# Patient Record
Sex: Female | Born: 1975
Health system: Southern US, Community
[De-identification: ages and names within clinical notes are randomized; demographics above are authoritative.]

## PROBLEM LIST (undated history)

## (undated) DIAGNOSIS — F419 Anxiety disorder, unspecified: Secondary | ICD-10-CM

## (undated) DIAGNOSIS — M329 Systemic lupus erythematosus, unspecified: Secondary | ICD-10-CM

## (undated) DIAGNOSIS — F3181 Bipolar II disorder: Secondary | ICD-10-CM

## (undated) DIAGNOSIS — G4700 Insomnia, unspecified: Secondary | ICD-10-CM

## (undated) DIAGNOSIS — M1990 Unspecified osteoarthritis, unspecified site: Secondary | ICD-10-CM

## (undated) DIAGNOSIS — I2699 Other pulmonary embolism without acute cor pulmonale: Secondary | ICD-10-CM

## (undated) DIAGNOSIS — IMO0002 Reserved for concepts with insufficient information to code with codable children: Secondary | ICD-10-CM

## (undated) HISTORY — DX: Bipolar II disorder: F31.81

## (undated) HISTORY — DX: Other pulmonary embolism without acute cor pulmonale: I26.99

## (undated) HISTORY — DX: Reserved for concepts with insufficient information to code with codable children: IMO0002

## (undated) HISTORY — DX: Insomnia, unspecified: G47.00

## (undated) HISTORY — DX: Systemic lupus erythematosus, unspecified: M32.9

## (undated) HISTORY — PX: TOTAL VAGINAL HYSTERECTOMY: SHX2548

## (undated) HISTORY — DX: Unspecified osteoarthritis, unspecified site: M19.90

## (undated) HISTORY — PX: OTHER SURGICAL HISTORY: SHX169

## (undated) HISTORY — DX: Anxiety disorder, unspecified: F41.9

## (undated) HISTORY — PX: CHOLECYSTECTOMY: SHX55

## (undated) HISTORY — PX: FOOT SURGERY: SHX648

---

## 2014-05-05 DIAGNOSIS — Z86711 Personal history of pulmonary embolism: Secondary | ICD-10-CM | POA: Insufficient documentation

## 2014-05-05 HISTORY — DX: Personal history of pulmonary embolism: Z86.711

## 2016-08-13 DIAGNOSIS — Z8679 Personal history of other diseases of the circulatory system: Secondary | ICD-10-CM

## 2016-08-13 HISTORY — DX: Personal history of other diseases of the circulatory system: Z86.79

## 2016-10-09 DIAGNOSIS — R296 Repeated falls: Secondary | ICD-10-CM | POA: Insufficient documentation

## 2016-10-09 HISTORY — DX: Repeated falls: R29.6

## 2017-01-14 DIAGNOSIS — B029 Zoster without complications: Secondary | ICD-10-CM | POA: Insufficient documentation

## 2017-01-14 HISTORY — DX: Zoster without complications: B02.9

## 2017-03-11 DIAGNOSIS — M1711 Unilateral primary osteoarthritis, right knee: Secondary | ICD-10-CM

## 2017-03-11 HISTORY — DX: Unilateral primary osteoarthritis, right knee: M17.11

## 2018-05-11 DIAGNOSIS — F329 Major depressive disorder, single episode, unspecified: Secondary | ICD-10-CM

## 2018-05-11 HISTORY — DX: Major depressive disorder, single episode, unspecified: F32.9

## 2018-06-18 DIAGNOSIS — Z79899 Other long term (current) drug therapy: Secondary | ICD-10-CM

## 2018-06-18 DIAGNOSIS — Z86718 Personal history of other venous thrombosis and embolism: Secondary | ICD-10-CM

## 2018-06-18 DIAGNOSIS — J449 Chronic obstructive pulmonary disease, unspecified: Secondary | ICD-10-CM | POA: Insufficient documentation

## 2018-06-18 HISTORY — DX: Personal history of other venous thrombosis and embolism: Z86.718

## 2018-06-18 HISTORY — DX: Chronic obstructive pulmonary disease, unspecified: J44.9

## 2018-06-18 HISTORY — DX: Other long term (current) drug therapy: Z79.899

## 2018-06-25 ENCOUNTER — Ambulatory Visit: Payer: Medicaid Other | Admitting: Cardiology

## 2018-07-02 ENCOUNTER — Ambulatory Visit (INDEPENDENT_AMBULATORY_CARE_PROVIDER_SITE_OTHER): Payer: Medicaid Other | Admitting: Cardiology

## 2018-07-02 ENCOUNTER — Encounter: Payer: Self-pay | Admitting: Cardiology

## 2018-07-02 VITALS — BP 124/82 | HR 77 | Ht 68.0 in | Wt 252.0 lb

## 2018-07-02 DIAGNOSIS — E782 Mixed hyperlipidemia: Secondary | ICD-10-CM

## 2018-07-02 DIAGNOSIS — Z86718 Personal history of other venous thrombosis and embolism: Secondary | ICD-10-CM | POA: Diagnosis not present

## 2018-07-02 DIAGNOSIS — Z86711 Personal history of pulmonary embolism: Secondary | ICD-10-CM

## 2018-07-02 DIAGNOSIS — R011 Cardiac murmur, unspecified: Secondary | ICD-10-CM

## 2018-07-02 HISTORY — DX: Mixed hyperlipidemia: E78.2

## 2018-07-02 NOTE — Patient Instructions (Signed)
Medication Instructions:  Your physician recommends that you continue on your current medications as directed. Please refer to the Current Medication list given to you today. If you need a refill on your cardiac medications before your next appointment, please call your pharmacy.   Lab work: None   If you have labs (blood work) drawn today and your tests are completely normal, you will receive your results only by: . MyChart Message (if you have MyChart) OR . A paper copy in the mail If you have any lab test that is abnormal or we need to change your treatment, we will call you to review the results.  Testing/Procedures: Your physician has requested that you have an echocardiogram. Echocardiography is a painless test that uses sound waves to create images of your heart. It provides your doctor with information about the size and shape of your heart and how well your heart's chambers and valves are working. This procedure takes approximately one hour. There are no restrictions for this procedure.   Echocardiogram An echocardiogram is a procedure that uses painless sound waves (ultrasound) to produce an image of the heart. Images from an echocardiogram can provide important information about:  Signs of coronary artery disease (CAD).  Aneurysm detection. An aneurysm is a weak or damaged part of an artery wall that bulges out from the normal force of blood pumping through the body.  Heart size and shape. Changes in the size or shape of the heart can be associated with certain conditions, including heart failure, aneurysm, and CAD.  Heart muscle function.  Heart valve function.  Signs of a past heart attack.  Fluid buildup around the heart.  Thickening of the heart muscle.  A tumor or infectious growth around the heart valves. Tell a health care provider about:  Any allergies you have.  All medicines you are taking, including vitamins, herbs, eye drops, creams, and over-the-counter  medicines.  Any blood disorders you have.  Any surgeries you have had.  Any medical conditions you have.  Whether you are pregnant or may be pregnant. What are the risks? Generally, this is a safe procedure. However, problems may occur, including:  Allergic reaction to dye (contrast) that may be used during the procedure. What happens before the procedure? No specific preparation is needed. You may eat and drink normally. What happens during the procedure?   An IV tube may be inserted into one of your veins.  You may receive contrast through this tube. A contrast is an injection that improves the quality of the pictures from your heart.  A gel will be applied to your chest.  A wand-like tool (transducer) will be moved over your chest. The gel will help to transmit the sound waves from the transducer.  The sound waves will harmlessly bounce off of your heart to allow the heart images to be captured in real-time motion. The images will be recorded on a computer. The procedure may vary among health care providers and hospitals. What happens after the procedure?  You may return to your normal, everyday life, including diet, activities, and medicines, unless your health care provider tells you not to do that. Summary  An echocardiogram is a procedure that uses painless sound waves (ultrasound) to produce an image of the heart.  Images from an echocardiogram can provide important information about the size and shape of your heart, heart muscle function, heart valve function, and fluid buildup around your heart.  You do not need to do anything to   prepare before this procedure. You may eat and drink normally.  After the echocardiogram is completed, you may return to your normal, everyday life, unless your health care provider tells you not to do that. This information is not intended to replace advice given to you by your health care provider. Make sure you discuss any questions you  have with your health care provider. Document Released: 04/18/2000 Document Revised: 05/24/2016 Document Reviewed: 05/24/2016 Elsevier Interactive Patient Education  2019 Elsevier Inc.   Follow-Up: At [facility], you and your health needs are our priority.  As part of our continuing mission to provide you with exceptional heart care, we have created designated Provider Care Teams.  These Care Teams include your primary Cardiologist (physician) and Advanced Practice Providers (APPs -  Physician Assistants and Nurse Practitioners) who all work together to provide you with the care you need, when you need it. You will need a follow up appointment in 6 months.  Please call our office 2 months in advance to schedule this appointment.  You may see Dr. Revankar or another member of our [facility] Provider Team in [place]: Robert Krasowski, MD . Brian Munley, MD     

## 2018-07-02 NOTE — Addendum Note (Signed)
Addended by: Rodney Langton on: 07/02/2018 10:02 AM   Modules accepted: Orders

## 2018-07-02 NOTE — Progress Notes (Signed)
Cardiology Office Note:    Date:  07/02/2018   ID:  Kathleen Hahn, DOB 11-30-1975, MRN [id number]  PCP:  Dulce Sellar, NP  Cardiologist:  Garwin Brothers, MD   Referring MD: Dulce Sellar, NP    ASSESSMENT:    1. Cardiac murmur   2. History of DVT (deep vein thrombosis)   3. History of pulmonary embolus (PE)   4. Mixed dyslipidemia    PLAN:    In order of problems listed above:  1. Primary prevention stressed with the patient.  Importance of compliance with diet and medication stressed and she vocalized understanding 2. .  Her anticoagulation is followed by her primary care physician.  Benefits and potential risks explained to her. 3. Echocardiogram will be done to assess murmur heard on auscultation. 4. Importance of regular exercise and weight loss stressed.  Risks of obesity explained 5. Patient will be seen in follow-up appointment in 6 months or earlier if the patient has any concerns    Medication Adjustments/Labs and Tests Ordered: Current medicines are reviewed at length with the patient today.  Concerns regarding medicines are outlined above.  No orders of the defined types were placed in this encounter.  No orders of the defined types were placed in this encounter.    History of Present Illness:    Kathleen Hahn is a 43 y.o. female who is being seen today for the evaluation of essential hypertension and history of pericardial effusion.  At the request of Dulce Sellar, NP.  Patient is a pleasant 43 year old female.  She is accompanied by her husband.  She has history of pericardial effusion post pericardiocentesis twice in the past more than 2 years ago.  She denies any problems at this time.  No chest pain orthopnea or PND.  She is here to be established.  She has had history of DVT and pulmonary embolism and is taking anticoagulation.  At the time of my evaluation, the patient is alert awake oriented and in no distress.  She leads a sedentary lifestyle  and does not exercise on a regular basis.  No history of chest pain orthopnea or PND.  Past Medical History:  Diagnosis Date  . Anxiety   . Arthritis   . Bipolar 2 disorder (HCC)   . Insomnia   . Lupus (HCC)   . Pulmonary embolism (HCC)     History reviewed. No pertinent surgical history.  Current Medications: Current Meds  Medication Sig  . apixaban (ELIQUIS) 5 MG TABS tablet Take 1 tablet by mouth 2 (two) times daily.  . budesonide-formoterol (SYMBICORT) 160-4.5 MCG/ACT inhaler INHALE 2 PUFFS BY MOUTH TWICE DAILY AS NEEDED  . busPIRone (BUSPAR) 15 MG tablet Take 1 tablet by mouth 3 (three) times daily.  Marland Kitchen diltiazem (TIAZAC) 180 MG 24 hr capsule Take 180 mg by mouth daily.  Marland Kitchen FLUoxetine (PROZAC) 40 MG capsule Take 1 capsule by mouth daily.  . hydroxychloroquine (PLAQUENIL) 200 MG tablet Take 1 tablet by mouth 2 (two) times daily.  . QUEtiapine (SEROQUEL) 25 MG tablet Take 1 tablet by mouth daily.  . simvastatin (ZOCOR) 20 MG tablet Take 20 mg by mouth at bedtime.  Marland Kitchen VIBERZI 100 MG TABS Take 1 tablet by mouth 2 (two) times daily.  Marland Kitchen zolpidem (AMBIEN) 10 MG tablet Take 10 mg by mouth at bedtime.     Allergies:   Penicillin g   Social History   Socioeconomic History  . Marital status: Unknown    Spouse name: Not on  file  . Number of children: Not on file  . Years of education: Not on file  . Highest education level: Not on file  Occupational History  . Not on file  Social Needs  . Financial resource strain: Not on file  . Food insecurity:    Worry: Not on file    Inability: Not on file  . Transportation needs:    Medical: Not on file    Non-medical: Not on file  Tobacco Use  . Smoking status: Former Games developer  . Smokeless tobacco: Never Used  Substance and Sexual Activity  . Alcohol use: Not on file  . Drug use: Not on file  . Sexual activity: Not on file  Lifestyle  . Physical activity:    Days per week: Not on file    Minutes per session: Not on file  .  Stress: Not on file  Relationships  . Social connections:    Talks on phone: Not on file    Gets together: Not on file    Attends religious service: Not on file    Active member of club or organization: Not on file    Attends meetings of clubs or organizations: Not on file    Relationship status: Not on file  Other Topics Concern  . Not on file  Social History Narrative  . Not on file     Family History: The patient's family history is not on file.  ROS:   Please see the history of present illness.    All other systems reviewed and are negative.  EKGs/Labs/Other Studies Reviewed:    The following studies were reviewed today: EKG reveals sinus rhythm and nonspecific ST-T changes.   Recent Labs: No results found for requested labs within last 8760 hours.  Recent Lipid Panel No results found for: CHOL, TRIG, HDL, CHOLHDL, VLDL, LDLCALC, LDLDIRECT  Physical Exam:    VS:  BP 124/82 (BP Location: Right Arm, Patient Position: Sitting, Cuff Size: Normal)   Pulse 77   Ht 5\' 8"  (1.727 m)   Wt 252 lb (114.3 kg)   SpO2 92%   BMI 38.32 kg/m     Wt Readings from Last 3 Encounters:  07/02/18 252 lb (114.3 kg)     GEN: Patient is in no acute distress HEENT: Normal NECK: No JVD; No carotid bruits LYMPHATICS: No lymphadenopathy CARDIAC: S1 S2 regular, 2/6 systolic murmur at the apex. RESPIRATORY:  Clear to auscultation without rales, wheezing or rhonchi  ABDOMEN: Soft, non-tender, non-distended MUSCULOSKELETAL:  No edema; No deformity  SKIN: Warm and dry NEUROLOGIC:  Alert and oriented x 3 PSYCHIATRIC:  Normal affect    Signed, Garwin Brothers, MD  07/02/2018 9:49 AM    [facility]

## 2018-08-05 ENCOUNTER — Other Ambulatory Visit: Payer: Medicaid Other

## 2018-10-27 DIAGNOSIS — R7989 Other specified abnormal findings of blood chemistry: Secondary | ICD-10-CM | POA: Insufficient documentation

## 2018-10-27 DIAGNOSIS — G5602 Carpal tunnel syndrome, left upper limb: Secondary | ICD-10-CM | POA: Insufficient documentation

## 2018-10-27 DIAGNOSIS — M329 Systemic lupus erythematosus, unspecified: Secondary | ICD-10-CM | POA: Insufficient documentation

## 2018-10-27 HISTORY — DX: Carpal tunnel syndrome, left upper limb: G56.02

## 2018-10-27 HISTORY — DX: Other specified abnormal findings of blood chemistry: R79.89

## 2018-11-03 DIAGNOSIS — F329 Major depressive disorder, single episode, unspecified: Secondary | ICD-10-CM | POA: Diagnosis not present

## 2018-11-12 DIAGNOSIS — Z1231 Encounter for screening mammogram for malignant neoplasm of breast: Secondary | ICD-10-CM | POA: Diagnosis not present

## 2018-11-17 DIAGNOSIS — F329 Major depressive disorder, single episode, unspecified: Secondary | ICD-10-CM | POA: Diagnosis not present

## 2018-11-19 DIAGNOSIS — R200 Anesthesia of skin: Secondary | ICD-10-CM | POA: Diagnosis not present

## 2018-11-19 DIAGNOSIS — R748 Abnormal levels of other serum enzymes: Secondary | ICD-10-CM | POA: Diagnosis not present

## 2018-11-19 DIAGNOSIS — E538 Deficiency of other specified B group vitamins: Secondary | ICD-10-CM | POA: Diagnosis not present

## 2018-11-19 DIAGNOSIS — G4700 Insomnia, unspecified: Secondary | ICD-10-CM | POA: Diagnosis not present

## 2018-11-19 DIAGNOSIS — M1990 Unspecified osteoarthritis, unspecified site: Secondary | ICD-10-CM | POA: Diagnosis not present

## 2018-11-19 DIAGNOSIS — G5602 Carpal tunnel syndrome, left upper limb: Secondary | ICD-10-CM | POA: Diagnosis not present

## 2018-11-19 DIAGNOSIS — R5383 Other fatigue: Secondary | ICD-10-CM | POA: Diagnosis not present

## 2018-11-24 DIAGNOSIS — F329 Major depressive disorder, single episode, unspecified: Secondary | ICD-10-CM | POA: Diagnosis not present

## 2018-11-25 DIAGNOSIS — Z6841 Body Mass Index (BMI) 40.0 and over, adult: Secondary | ICD-10-CM | POA: Diagnosis not present

## 2018-11-25 DIAGNOSIS — R748 Abnormal levels of other serum enzymes: Secondary | ICD-10-CM | POA: Diagnosis not present

## 2018-11-25 DIAGNOSIS — R5383 Other fatigue: Secondary | ICD-10-CM | POA: Diagnosis not present

## 2018-11-25 DIAGNOSIS — M329 Systemic lupus erythematosus, unspecified: Secondary | ICD-10-CM | POA: Diagnosis not present

## 2018-11-26 DIAGNOSIS — R945 Abnormal results of liver function studies: Secondary | ICD-10-CM | POA: Diagnosis not present

## 2018-11-26 DIAGNOSIS — R531 Weakness: Secondary | ICD-10-CM | POA: Diagnosis not present

## 2018-11-26 DIAGNOSIS — R740 Nonspecific elevation of levels of transaminase and lactic acid dehydrogenase [LDH]: Secondary | ICD-10-CM | POA: Diagnosis not present

## 2018-11-26 DIAGNOSIS — R1084 Generalized abdominal pain: Secondary | ICD-10-CM | POA: Diagnosis not present

## 2018-11-26 DIAGNOSIS — J449 Chronic obstructive pulmonary disease, unspecified: Secondary | ICD-10-CM | POA: Diagnosis not present

## 2018-12-03 ENCOUNTER — Telehealth: Payer: Self-pay | Admitting: *Deleted

## 2018-12-03 DIAGNOSIS — R7989 Other specified abnormal findings of blood chemistry: Secondary | ICD-10-CM | POA: Diagnosis not present

## 2018-12-03 DIAGNOSIS — M329 Systemic lupus erythematosus, unspecified: Secondary | ICD-10-CM | POA: Diagnosis not present

## 2018-12-03 DIAGNOSIS — Z79899 Other long term (current) drug therapy: Secondary | ICD-10-CM | POA: Diagnosis not present

## 2018-12-03 NOTE — Telephone Encounter (Signed)
Sent pt a calender with new appt date. Spoke with pt also.

## 2018-12-06 DIAGNOSIS — F329 Major depressive disorder, single episode, unspecified: Secondary | ICD-10-CM | POA: Diagnosis not present

## 2018-12-10 DIAGNOSIS — R748 Abnormal levels of other serum enzymes: Secondary | ICD-10-CM | POA: Diagnosis not present

## 2018-12-21 DIAGNOSIS — F319 Bipolar disorder, unspecified: Secondary | ICD-10-CM | POA: Diagnosis not present

## 2018-12-21 DIAGNOSIS — R7989 Other specified abnormal findings of blood chemistry: Secondary | ICD-10-CM | POA: Diagnosis not present

## 2018-12-21 DIAGNOSIS — F419 Anxiety disorder, unspecified: Secondary | ICD-10-CM | POA: Diagnosis not present

## 2018-12-21 DIAGNOSIS — N3281 Overactive bladder: Secondary | ICD-10-CM | POA: Diagnosis not present

## 2018-12-21 DIAGNOSIS — R000 Tachycardia, unspecified: Secondary | ICD-10-CM | POA: Diagnosis not present

## 2018-12-21 DIAGNOSIS — I82409 Acute embolism and thrombosis of unspecified deep veins of unspecified lower extremity: Secondary | ICD-10-CM | POA: Diagnosis not present

## 2018-12-21 DIAGNOSIS — E559 Vitamin D deficiency, unspecified: Secondary | ICD-10-CM | POA: Diagnosis not present

## 2018-12-21 DIAGNOSIS — Z7901 Long term (current) use of anticoagulants: Secondary | ICD-10-CM | POA: Diagnosis not present

## 2018-12-21 DIAGNOSIS — F329 Major depressive disorder, single episode, unspecified: Secondary | ICD-10-CM | POA: Diagnosis not present

## 2018-12-21 DIAGNOSIS — M329 Systemic lupus erythematosus, unspecified: Secondary | ICD-10-CM | POA: Diagnosis not present

## 2018-12-21 DIAGNOSIS — F3160 Bipolar disorder, current episode mixed, unspecified: Secondary | ICD-10-CM | POA: Diagnosis not present

## 2018-12-23 DIAGNOSIS — R109 Unspecified abdominal pain: Secondary | ICD-10-CM | POA: Diagnosis not present

## 2018-12-23 DIAGNOSIS — R7989 Other specified abnormal findings of blood chemistry: Secondary | ICD-10-CM | POA: Diagnosis not present

## 2018-12-31 ENCOUNTER — Ambulatory Visit: Payer: Medicaid Other | Admitting: Cardiology

## 2018-12-31 DIAGNOSIS — M1711 Unilateral primary osteoarthritis, right knee: Secondary | ICD-10-CM | POA: Diagnosis not present

## 2019-01-05 DIAGNOSIS — K029 Dental caries, unspecified: Secondary | ICD-10-CM | POA: Diagnosis not present

## 2019-01-05 DIAGNOSIS — M1711 Unilateral primary osteoarthritis, right knee: Secondary | ICD-10-CM | POA: Diagnosis not present

## 2019-01-05 DIAGNOSIS — M25561 Pain in right knee: Secondary | ICD-10-CM | POA: Diagnosis not present

## 2019-01-06 DIAGNOSIS — N200 Calculus of kidney: Secondary | ICD-10-CM | POA: Diagnosis not present

## 2019-01-07 ENCOUNTER — Encounter: Payer: Self-pay | Admitting: Cardiology

## 2019-01-07 ENCOUNTER — Ambulatory Visit (INDEPENDENT_AMBULATORY_CARE_PROVIDER_SITE_OTHER): Payer: Medicare HMO | Admitting: Cardiology

## 2019-01-07 ENCOUNTER — Other Ambulatory Visit: Payer: Self-pay

## 2019-01-07 VITALS — BP 120/80 | HR 83 | Ht 68.0 in | Wt 256.0 lb

## 2019-01-07 DIAGNOSIS — Z86711 Personal history of pulmonary embolism: Secondary | ICD-10-CM

## 2019-01-07 DIAGNOSIS — Z86718 Personal history of other venous thrombosis and embolism: Secondary | ICD-10-CM | POA: Diagnosis not present

## 2019-01-07 DIAGNOSIS — E782 Mixed hyperlipidemia: Secondary | ICD-10-CM | POA: Diagnosis not present

## 2019-01-07 DIAGNOSIS — R0609 Other forms of dyspnea: Secondary | ICD-10-CM | POA: Diagnosis not present

## 2019-01-07 NOTE — Progress Notes (Signed)
Cardiology Office Note:    Date:  01/07/2019   ID:  Kathleen Hahn, DOB 07/14/1975, MRN [id number]  PCP:  Dola Factor., MD  Cardiologist:  Jenean Lindau, MD   Referring MD: Jeanie Sewer, NP    ASSESSMENT:    1. Mixed dyslipidemia   2. History of pulmonary embolus (PE)   3. History of DVT (deep vein thrombosis)    PLAN:    In order of problems listed above:  1. Dyspnea on exertion: Her symptoms are concerning.  In view of this she will come back to get her echocardiogram that was scheduled last time.  I also told her that she needs to get coronary artery disease evaluation and a Lexiscan sestamibi with 2-day protocol and she is agreeable.  Benefits and potential risks explained and she vocalized understanding.  She knows to go to the nearest emergency room for any concerning symptoms.Patient will be seen in follow-up appointment in 6 months or earlier if the patient has any concerns    Medication Adjustments/Labs and Tests Ordered: Current medicines are reviewed at length with the patient today.  Concerns regarding medicines are outlined above.  No orders of the defined types were placed in this encounter.  No orders of the defined types were placed in this encounter.    Chief Complaint  Patient presents with  . Follow-up     History of Present Illness:    Kathleen Hahn is a 43 y.o. female.  Patient has history of deep venous thrombosis and pulmonary embolism so she is on anticoagulation.  She denies any problems at this time her only issue shortness of breath on exertion.  It has been persistent since the last time she saw me.  She did not get any testing done because of COVID-19.  She wanted to hold off from it.  She is a smoker but quit some time ago.  At the time of my evaluation she is alert awake oriented and in no distress.  Past Medical History:  Diagnosis Date  . Anxiety   . Arthritis   . Bipolar 2 disorder ([place])   . Insomnia   . Lupus ([place])   .  Pulmonary embolism ([place])     No past surgical history on file.  Current Medications: Current Meds  Medication Sig  . albuterol (PROAIR HFA) 108 (90 Base) MCG/ACT inhaler INHALE 2 PUFFS BY MOUTH EVERY 4 TO 6 HOURS AS NEEDED  . apixaban (ELIQUIS) 5 MG TABS tablet Take 1 tablet by mouth 2 (two) times daily.  . budesonide-formoterol (SYMBICORT) 160-4.5 MCG/ACT inhaler INHALE 2 PUFFS BY MOUTH TWICE DAILY AS NEEDED  . celecoxib (CELEBREX) 200 MG capsule Take 200 mg by mouth daily.  . Cholecalciferol (VITAMIN D3) 1.25 MG (50000 UT) CAPS Take 1 capsule by mouth once a week.  . diltiazem (TIAZAC) 180 MG 24 hr capsule Take 180 mg by mouth daily.  . hydrochlorothiazide (MICROZIDE) 12.5 MG capsule Take 12.5 mg by mouth daily.  . hydroxychloroquine (PLAQUENIL) 200 MG tablet Take 1 tablet by mouth 2 (two) times daily.  . ondansetron (ZOFRAN-ODT) 8 MG disintegrating tablet every 8 hours.  Marland Kitchen oxybutynin (DITROPAN) 5 MG tablet Take 5 mg by mouth 3 (three) times daily.  . pregabalin (LYRICA) 100 MG capsule TAKE 1 (ONE) CAPSULE CAPSULE TWO TIMES DAILY  . triamcinolone acetonide (KENALOG-40) 40 MG/ML injection (RADIOLOGY ONLY) Inject into the articular space.  . valACYclovir (VALTREX) 500 MG tablet TAKE 4 TABLETS NOW, 4 TABLETS IN 12 HOURS. MAY  REPEAT FOR NEW OUTBREAKS  . VIBERZI 100 MG TABS Take 1 tablet by mouth 2 (two) times daily.  Marland Kitchen. zolpidem (AMBIEN) 10 MG tablet Take 10 mg by mouth at bedtime.     Allergies:   Penicillin g   Social History   Socioeconomic History  . Marital status: Unknown    Spouse name: Not on file  . Number of children: Not on file  . Years of education: Not on file  . Highest education level: Not on file  Occupational History  . Not on file  Social Needs  . Financial resource strain: Not on file  . Food insecurity    Worry: Not on file    Inability: Not on file  . Transportation needs    Medical: Not on file    Non-medical: Not on file  Tobacco Use  . Smoking  status: Former Games developermoker  . Smokeless tobacco: Never Used  Substance and Sexual Activity  . Alcohol use: Not on file  . Drug use: Not on file  . Sexual activity: Not on file  Lifestyle  . Physical activity    Days per week: Not on file    Minutes per session: Not on file  . Stress: Not on file  Relationships  . Social Musicianconnections    Talks on phone: Not on file    Gets together: Not on file    Attends religious service: Not on file    Active member of club or organization: Not on file    Attends meetings of clubs or organizations: Not on file    Relationship status: Not on file  Other Topics Concern  . Not on file  Social History Narrative  . Not on file     Family History: The patient's family history is not on file.  ROS:   Please see the history of present illness.    All other systems reviewed and are negative.  EKGs/Labs/Other Studies Reviewed:    The following studies were reviewed today: I discussed and reviewed EKG with her    Recent Labs: No results found for requested labs within last 8760 hours.  Recent Lipid Panel No results found for: CHOL, TRIG, HDL, CHOLHDL, VLDL, LDLCALC, LDLDIRECT  Physical Exam:    VS:  BP 120/80 (BP Location: Left Arm, Patient Position: Sitting, Cuff Size: Normal)   Pulse 83   Ht 5\' 8"  (1.727 m)   Wt 256 lb (116.1 kg)   SpO2 98%   BMI 38.92 kg/m     Wt Readings from Last 3 Encounters:  01/07/19 256 lb (116.1 kg)  07/02/18 252 lb (114.3 kg)     GEN: Patient is in no acute distress HEENT: Normal NECK: No JVD; No carotid bruits LYMPHATICS: No lymphadenopathy CARDIAC: Hear sounds regular, 2/6 systolic murmur at the apex. RESPIRATORY:  Clear to auscultation without rales, wheezing or rhonchi  ABDOMEN: Soft, non-tender, non-distended MUSCULOSKELETAL:  No edema; No deformity  SKIN: Warm and dry NEUROLOGIC:  Alert and oriented x 3 PSYCHIATRIC:  Normal affect   Signed, Garwin Brothersajan R , MD  01/07/2019 4:20 PM    [facility]

## 2019-01-07 NOTE — Patient Instructions (Addendum)
Medication Instructions:  Your physician recommends that you continue on your current medications as directed. Please refer to the Current Medication list given to you today.  If you need a refill on your cardiac medications before your next appointment, please call your pharmacy.   Lab work: NONE If you have labs (blood work) drawn today and your tests are completely normal, you will receive your results only by: Marland Kitchen. MyChart Message (if you have MyChart) OR . A paper copy in the mail If you have any lab test that is abnormal or we need to change your treatment, we will call you to review the results.  Testing/Procedures:  Your physician has requested that you have an echocardiogram. Echocardiography is a painless test that uses sound waves to create images of your heart. It provides your doctor with information about the size and shape of your heart and how well your heart's chambers and valves are working. This procedure takes approximately one hour. There are no restrictions for this procedure.  Your physician has requested that you have a lexiscan myoview. For further information please visit [email or web address]. Please follow instruction sheet, as given.    Follow-Up: At [facility], you and your health needs are our priority.  As part of our continuing mission to provide you with exceptional heart care, we have created designated Provider Care Teams.  These Care Teams include your primary Cardiologist (physician) and Advanced Practice Providers (APPs -  Physician Assistants and Nurse Practitioners) who all work together to provide you with the care you need, when you need it. You will need a follow up appointment in 6 months.   Any Other Special Instructions Will Be Listed Below  Regadenoson injection What is this medicine? REGADENOSON is used to test the heart for coronary artery disease. It is used in patients who can not exercise for their stress test. This medicine may be used  for other purposes; ask your health care provider or pharmacist if you have questions. COMMON BRAND NAME(S): Lexiscan What should I tell my health care provider before I take this medicine? They need to know if you have any of these conditions:  heart problems  lung or breathing disease, like asthma or COPD  an unusual or allergic reaction to regadenoson, other medicines, foods, dyes, or preservatives  pregnant or trying to get pregnant  breast-feeding How should I use this medicine? This medicine is for injection into a vein. It is given by a health care professional in a hospital or clinic setting. Talk to your pediatrician regarding the use of this medicine in children. Special care may be needed. Overdosage: If you think you have taken too much of this medicine contact a poison control center or emergency room at once. NOTE: This medicine is only for you. Do not share this medicine with others. What if I miss a dose? This does not apply. What may interact with this medicine?  caffeine  dipyridamole  guarana  theophylline This list may not describe all possible interactions. Give your health care provider a list of all the medicines, herbs, non-prescription drugs, or dietary supplements you use. Also tell them if you smoke, drink alcohol, or use illegal drugs. Some items may interact with your medicine. What should I watch for while using this medicine? Your condition will be monitored carefully while you are receiving this medicine. Do not take medicines, foods, or drinks with caffeine (like coffee, tea, or colas) for at least 12 hours before your test. If you do  not know if something contains caffeine, ask your health care professional. What side effects may I notice from receiving this medicine? Side effects that you should report to your doctor or health care professional as soon as possible:  allergic reactions like skin rash, itching or hives, swelling of the face, lips,  or tongue  breathing problems  chest pain, tightness or palpitations  severe headache Side effects that usually do not require medical attention (report to your doctor or health care professional if they continue or are bothersome):  flushing  headache  irritation or pain at site where injected  nausea, vomiting This list may not describe all possible side effects. Call your doctor for medical advice about side effects. You may report side effects to FDA at 1-800-FDA-1088. Where should I keep my medicine? This drug is given in a hospital or clinic and will not be stored at home. NOTE: This sheet is a summary. It may not cover all possible information. If you have questions about this medicine, talk to your doctor, pharmacist, or health care provider.  2020 Elsevier/Gold Standard (2007-12-20 15:08:13)  Cardiac Nuclear Scan A cardiac nuclear scan is a test that is done to check the flow of blood to your heart. It is done when you are resting and when you are exercising. The test looks for problems such as:  Not enough blood reaching a portion of the heart.  The heart muscle not working as it should. You may need this test if:  You have heart disease.  You have had lab results that are not normal.  You have had heart surgery or a balloon procedure to open up blocked arteries (angioplasty).  You have chest pain.  You have shortness of breath. In this test, a special dye (tracer) is put into your bloodstream. The tracer will travel to your heart. A camera will then take pictures of your heart to see how the tracer moves through your heart. This test is usually done at a hospital and takes 2-4 hours. Tell a doctor about:  Any allergies you have.  All medicines you are taking, including vitamins, herbs, eye drops, creams, and over-the-counter medicines.  Any problems you or family members have had with anesthetic medicines.  Any blood disorders you have.  Any surgeries you  have had.  Any medical conditions you have.  Whether you are pregnant or may be pregnant. What are the risks? Generally, this is a safe test. However, problems may occur, such as:  Serious chest pain and heart attack. This is only a risk if the stress portion of the test is done.  Rapid heartbeat.  A feeling of warmth in your chest. This feeling usually does not last long.  Allergic reaction to the tracer. What happens before the test?  Ask your doctor about changing or stopping your normal medicines. This is important.  Follow instructions from your doctor about what you cannot eat or drink.  Remove your jewelry on the day of the test. What happens during the test?  An IV tube will be inserted into one of your veins.  Your doctor will give you a small amount of tracer through the IV tube.  You will wait for 20-40 minutes while the tracer moves through your bloodstream.  Your heart will be monitored with an electrocardiogram (ECG).  You will lie down on an exam table.  Pictures of your heart will be taken for about 15-20 minutes.  You may also have a stress test. For  this test, one of these things may be done: ? You will be asked to exercise on a treadmill or a stationary bike. ? You will be given medicines that will make your heart work harder. This is done if you are unable to exercise.  When blood flow to your heart has peaked, a tracer will again be given through the IV tube.  After 20-40 minutes, you will get back on the exam table. More pictures will be taken of your heart.  Depending on the tracer that is used, more pictures may need to be taken 3-4 hours later.  Your IV tube will be removed when the test is over. The test may vary among doctors and hospitals. What happens after the test?  Ask your doctor: ? Whether you can return to your normal schedule, including diet, activities, and medicines. ? Whether you should drink more fluids. This will help to  remove the tracer from your body. Drink enough fluid to keep your pee (urine) pale yellow.  Ask your doctor, or the department that is doing the test: ? When will my results be ready? ? How will I get my results? Summary  A cardiac nuclear scan is a test that is done to check the flow of blood to your heart.  Tell your doctor whether you are pregnant or may be pregnant.  Before the test, ask your doctor about changing or stopping your normal medicines. This is important.  Ask your doctor whether you can return to your normal activities. You may be asked to drink more fluids. This information is not intended to replace advice given to you by your health care provider. Make sure you discuss any questions you have with your health care provider. Document Released: 10/05/2017 Document Revised: 08/11/2018 Document Reviewed: 10/05/2017 Elsevier Patient Education  2020 [employer].

## 2019-01-13 DIAGNOSIS — R200 Anesthesia of skin: Secondary | ICD-10-CM | POA: Diagnosis not present

## 2019-01-18 ENCOUNTER — Ambulatory Visit: Payer: Medicaid Other | Admitting: Cardiology

## 2019-01-19 DIAGNOSIS — F329 Major depressive disorder, single episode, unspecified: Secondary | ICD-10-CM | POA: Diagnosis not present

## 2019-01-25 ENCOUNTER — Other Ambulatory Visit: Payer: Medicare HMO

## 2019-02-02 DIAGNOSIS — F329 Major depressive disorder, single episode, unspecified: Secondary | ICD-10-CM | POA: Diagnosis not present

## 2019-02-04 DIAGNOSIS — M1711 Unilateral primary osteoarthritis, right knee: Secondary | ICD-10-CM | POA: Diagnosis not present

## 2019-02-09 ENCOUNTER — Telehealth (HOSPITAL_COMMUNITY): Payer: Self-pay | Admitting: *Deleted

## 2019-02-09 NOTE — Telephone Encounter (Signed)
Patient given detailed instructions per Myocardial Perfusion Study Information Sheet for the test on 02/15/19. Patient notified to arrive 15 minutes early and that it is imperative to arrive on time for appointment to keep from having the test rescheduled.  If you need to cancel or reschedule your appointment, please call the office within 24 hours of your appointment. . Patient verbalized understanding. Kathleen Hahn    

## 2019-02-15 ENCOUNTER — Ambulatory Visit (INDEPENDENT_AMBULATORY_CARE_PROVIDER_SITE_OTHER): Payer: Medicare HMO

## 2019-02-15 ENCOUNTER — Other Ambulatory Visit: Payer: Self-pay

## 2019-02-15 DIAGNOSIS — R0600 Dyspnea, unspecified: Secondary | ICD-10-CM

## 2019-02-15 MED ORDER — REGADENOSON 0.4 MG/5ML IV SOLN
0.4000 mg | Freq: Once | INTRAVENOUS | Status: AC
  Administered 2019-02-15: 0.4 mg via INTRAVENOUS

## 2019-02-15 MED ORDER — TECHNETIUM TC 99M TETROFOSMIN IV KIT
32.7000 | PACK | Freq: Once | INTRAVENOUS | Status: AC | PRN
  Administered 2019-02-15: 32.7 via INTRAVENOUS

## 2019-02-16 ENCOUNTER — Ambulatory Visit: Payer: Medicare HMO

## 2019-02-16 LAB — MYOCARDIAL PERFUSION IMAGING
LV dias vol: 103 mL (ref 46–106)
LV sys vol: 44 mL
Peak HR: 102 {beats}/min
Rest HR: 81 {beats}/min
SDS: 5
SRS: 4
SSS: 9
TID: 0.92

## 2019-02-16 MED ORDER — TECHNETIUM TC 99M TETROFOSMIN IV KIT
30.7000 | PACK | Freq: Once | INTRAVENOUS | Status: AC | PRN
  Administered 2019-02-16: 30.7 via INTRAVENOUS

## 2019-02-21 ENCOUNTER — Telehealth: Payer: Self-pay

## 2019-02-21 DIAGNOSIS — Z86718 Personal history of other venous thrombosis and embolism: Secondary | ICD-10-CM | POA: Diagnosis not present

## 2019-02-21 DIAGNOSIS — M329 Systemic lupus erythematosus, unspecified: Secondary | ICD-10-CM | POA: Diagnosis not present

## 2019-02-21 DIAGNOSIS — Z86711 Personal history of pulmonary embolism: Secondary | ICD-10-CM | POA: Diagnosis not present

## 2019-02-21 DIAGNOSIS — R7989 Other specified abnormal findings of blood chemistry: Secondary | ICD-10-CM | POA: Diagnosis not present

## 2019-02-21 MED ORDER — METOPROLOL TARTRATE 50 MG PO TABS: 50.0000 mg | ORAL_TABLET | Freq: Two times a day (BID) | ORAL | 3 refills | Status: DC

## 2019-02-21 MED ORDER — NITROGLYCERIN 0.4 MG SL SUBL: 0.4000 mg | SUBLINGUAL_TABLET | SUBLINGUAL | 3 refills | Status: DC | PRN

## 2019-02-21 MED ORDER — ASPIRIN EC 81 MG PO TBEC: 81.0000 mg | DELAYED_RELEASE_TABLET | Freq: Every day | ORAL | 3 refills | Status: AC

## 2019-02-21 NOTE — Telephone Encounter (Signed)
Kathleen Hahn's number rings fast busy, left message on Kathleen Hahn's line to have Santiago Glad call office for results.

## 2019-02-21 NOTE — Telephone Encounter (Signed)
-----   Message from Jenean Lindau, MD sent at 02/17/2019 11:51 AM EDT ----- Needs appointment to discuss these results meanwhile put the patient on the smallest dose of beta-blocker like metoprolol 25 mg twice daily, coated baby aspirin and sublingual nitroglycerin as needed.  I will see the patient in the next few days or she can also see Dr. Jorene Minors or any of my partners if there is availability Jenean Lindau, MD 02/17/2019 11:50 AM

## 2019-02-21 NOTE — Telephone Encounter (Signed)
Information relayed, scripts sent to [facility] per patient request. RN went over med instructions, scheduled pt for f/u in [place].

## 2019-02-21 NOTE — Addendum Note (Signed)
Addended by: Beckey Rutter on: 02/21/2019 04:43 PM   Modules accepted: Orders

## 2019-02-25 ENCOUNTER — Telehealth: Payer: Self-pay | Admitting: Cardiology

## 2019-02-25 ENCOUNTER — Other Ambulatory Visit: Payer: Medicare HMO

## 2019-02-25 NOTE — Telephone Encounter (Signed)
Please call about new medication

## 2019-02-25 NOTE — Telephone Encounter (Signed)
Patient states she was reading side effects of metoprolol and has not started taking it. RN explained the benefits of medication and advised patient to call office if she feels she is having side effects.

## 2019-02-28 ENCOUNTER — Other Ambulatory Visit: Payer: Self-pay

## 2019-02-28 ENCOUNTER — Ambulatory Visit (INDEPENDENT_AMBULATORY_CARE_PROVIDER_SITE_OTHER): Payer: Medicare HMO | Admitting: Cardiology

## 2019-02-28 ENCOUNTER — Encounter: Payer: Self-pay | Admitting: Cardiology

## 2019-02-28 VITALS — BP 86/62 | HR 66 | Ht 68.0 in | Wt 255.1 lb

## 2019-02-28 DIAGNOSIS — J431 Panlobular emphysema: Secondary | ICD-10-CM | POA: Diagnosis not present

## 2019-02-28 DIAGNOSIS — E782 Mixed hyperlipidemia: Secondary | ICD-10-CM

## 2019-02-28 DIAGNOSIS — Z86718 Personal history of other venous thrombosis and embolism: Secondary | ICD-10-CM | POA: Diagnosis not present

## 2019-02-28 DIAGNOSIS — R931 Abnormal findings on diagnostic imaging of heart and coronary circulation: Secondary | ICD-10-CM | POA: Insufficient documentation

## 2019-02-28 DIAGNOSIS — Z86711 Personal history of pulmonary embolism: Secondary | ICD-10-CM

## 2019-02-28 DIAGNOSIS — R9431 Abnormal electrocardiogram [ECG] [EKG]: Secondary | ICD-10-CM

## 2019-02-28 DIAGNOSIS — R946 Abnormal results of thyroid function studies: Secondary | ICD-10-CM | POA: Diagnosis not present

## 2019-02-28 HISTORY — DX: Abnormal findings on diagnostic imaging of heart and coronary circulation: R93.1

## 2019-02-28 NOTE — Patient Instructions (Addendum)
Medication Instructions:  Your physician has recommended you make the following change in your medication:   STOP taking hydrochlorithiazide   If you need a refill on your cardiac medications before your next appointment, please call your pharmacy.   Lab work: You will need to come in 7 days prior to procedure FASTING  to have a BMP, CBC, TSH, hepatic and lipid drawn.  If you have labs (blood work) drawn today and your tests are completely normal, you will receive your results only by: Marland Kitchen MyChart Message (if you have MyChart) OR . A paper copy in the mail If you have any lab test that is abnormal or we need to change your treatment, we will call you to review the results.  Testing/Procedures: Non-Cardiac CT scanning, (CAT scanning), is a noninvasive, special x-ray that produces cross-sectional images of the body using x-rays and a computer. CT scans help physicians diagnose and treat medical conditions. For some CT exams, a contrast material is used to enhance visibility in the area of the body being studied. CT scans provide greater clarity and reveal more details than regular x-ray exams.  Please arrive at the [facility] main entrance of [facility] at xx:xx AM (30-45 minutes prior to test start time)  [facility] [place], [place] [address] [phone]  Proceed to the [facility] Radiology Department (First Floor).  Please follow these instructions carefully (unless otherwise directed):  On the Night Before the Test: . Be sure to Drink plenty of water. . Do not consume any caffeinated/decaffeinated beverages or chocolate 12 hours prior to your test. . Do not take any antihistamines 12 hours prior to your test.  On the Day of the Test: . Drink plenty of water. Do not drink any water within one hour of the test. . Do not eat any food 4 hours prior to the test. . You may take your regular medications prior to the test.  . Take metoprolol  (Lopressor) two hours prior to test.      After the Test: . Drink plenty of water. . After receiving IV contrast, you may experience a mild flushed feeling. This is normal. . On occasion, you may experience a mild rash up to 24 hours after the test. This is not dangerous. If this occurs, you can take Benadryl 25 mg and increase your fluid intake. . If you experience trouble breathing, this can be serious. If it is severe call 911 IMMEDIATELY. If it is mild, please call our office. . If you take any of these medications: Glipizide/Metformin, Avandament, Glucavance, please do not take 48 hours after completing test.   Follow-Up: At [facility], you and your health needs are our priority.  As part of our continuing mission to provide you with exceptional heart care, we have created designated Provider Care Teams.  These Care Teams include your primary Cardiologist (physician) and Advanced Practice Providers (APPs -  Physician Assistants and Nurse Practitioners) who all work together to provide you with the care you need, when you need it. You will need a follow up appointment in 3 months.    Any Other Special Instructions Will Be Listed Below   Coronary Angiogram A coronary angiogram is an X-ray procedure that is used to examine the arteries in the heart. In this procedure, a dye (contrast dye) is injected through a long, thin tube (catheter). The catheter is inserted through the groin, wrist, or arm. The dye is injected into each artery,  then X-rays are taken to show if there is a blockage in the arteries of the heart. This procedure can also show if you have valve disease or a disease of the aorta, and it can be used to check the overall function of your heart muscle. You may have a coronary angiogram if:  You are having chest pain, or other symptoms of angina, and you are at risk for heart disease.  You have an abnormal electrocardiogram (ECG) or stress test.  You have chest pain and heart  failure.  You are having irregular heart rhythms.  You and your health care provider determine that the benefits of the test information outweigh the risks of the procedure. Let your health care provider know about:  Any allergies you have, including allergies to contrast dye.  All medicines you are taking, including vitamins, herbs, eye drops, creams, and over-the-counter medicines.  Any problems you or family members have had with anesthetic medicines.  Any blood disorders you have.  Any surgeries you have had.  History of kidney problems or kidney failure.  Any medical conditions you have.  Whether you are pregnant or may be pregnant. What are the risks? Generally, this is a safe procedure. However, problems may occur, including:  Infection.  Allergic reaction to medicines or dyes that are used.  Bleeding from the access site or other locations.  Kidney injury, especially in people with impaired kidney function.  Stroke (rare).  Heart attack (rare).  Damage to other structures or organs. What happens before the procedure? Staying hydrated Follow instructions from your health care provider about hydration, which may include:  Up to 2 hours before the procedure - you may continue to drink clear liquids, such as water, clear fruit juice, black coffee, and plain tea. Eating and drinking restrictions Follow instructions from your health care provider about eating and drinking, which may include:  8 hours before the procedure - stop eating heavy meals or foods such as meat, fried foods, or fatty foods.  6 hours before the procedure - stop eating light meals or foods, such as toast or cereal.  2 hours before the procedure - stop drinking clear liquids. General instructions  Ask your health care provider about: ? Changing or stopping your regular medicines. This is especially important if you are taking diabetes medicines or blood thinners. ? Taking medicines such as  ibuprofen. These medicines can thin your blood. Do not take these medicines before your procedure if your health care provider instructs you not to, though aspirin may be recommended prior to coronary angiograms.  Plan to have someone take you home from the hospital or clinic.  You may need to have blood tests or X-rays done. What happens during the procedure?  An IV tube will be inserted into one of your veins.  You will be given one or more of the following: ? A medicine to help you relax (sedative). ? A medicine to numb the area where the catheter will be inserted into an artery (local anesthetic).  To reduce your risk of infection: ? Your health care team will wash or sanitize their hands. ? Your skin will be washed with soap. ? Hair may be removed from the area where the catheter will be inserted.  You will be connected to a continuous ECG monitor.  The catheter will be inserted into an artery. The location may be in your groin, in your wrist, or in the fold of your arm (near your elbow).  A  type of X-ray (fluoroscopy) will be used to help guide the catheter to the opening of the blood vessel that is being examined.  A dye will be injected into the catheter, and X-rays will be taken. The dye will help to show where any narrowing or blockages are located in the heart arteries.  Tell your health care provider if you have any chest pain or trouble breathing during the procedure.  If blockages are found, your health care provider may perform another procedure, such as inserting a coronary stent. The procedure may vary among health care providers and hospitals. What happens after the procedure?  After the procedure, you will need to keep the area still for a few hours, or for as long as told by your health care provider. If the procedure is done through the groin, you will be instructed to not bend and not cross your legs.  The insertion site will be checked frequently.  The pulse  in your foot or wrist will be checked frequently.  You may have additional blood tests, X-rays, and a test that records the electrical activity of your heart (ECG).  Do not drive for 24 hours if you were given a sedative. Summary  A coronary angiogram is an X-ray procedure that is used to look into the arteries in the heart.  During the procedure, a dye (contrast dye) is injected through a long, thin tube (catheter). The catheter is inserted through the groin, wrist, or arm.  Tell your health care provider about any allergies you have, including allergies to contrast dye.  After the procedure, you will need to keep the area still for a few hours, or for as long as told by your health care provider. This information is not intended to replace advice given to you by your health care provider. Make sure you discuss any questions you have with your health care provider. Document Released: 10/26/2002 Document Revised: 02/01/2016 Document Reviewed: 02/01/2016 Elsevier Interactive Patient Education  2019 [employer].

## 2019-02-28 NOTE — Addendum Note (Signed)
Addended by: Beckey Rutter on: 02/28/2019 09:27 AM   Modules accepted: Orders

## 2019-02-28 NOTE — Progress Notes (Signed)
Cardiology Office Note:    Date:  02/28/2019   ID:  Kathleen Hahn, DOB November 29, 1975, MRN [id number]  PCP:  Dola Factor., MD  Cardiologist:  Jenean Lindau, MD   Referring MD: Dola Factor.*    ASSESSMENT:    1. Abnormal nuclear cardiac imaging test   2. Panlobular emphysema ([place])   3. Mixed dyslipidemia   4. History of pulmonary embolus (PE)   5. History of DVT (deep vein thrombosis)    PLAN:    In order of problems listed above:  1. Abnormal nuclear stress test: Patient has multiple risk factors for coronary artery disease in view of this we will plan to evaluate her further.  I gave her the option of CT coronary angiography and conventional coronary angiography and details were discussed with her.  She prefers the former.  We will set her up for this. 2. Low blood pressure: I discussed my findings with the patient at extensive length.  She wants to keep and continue the metoprolol because she feels her palpitations have improved.  She wants to keep herself well-hydrated and take a little extra salt in the diet and I am fine with this plan.  She has not had any dizzy spells or any such issues. 3. Obesity: Diet was discussed.  Weight reduction was stressed.  Risks of obesity explained and she will be seen in follow-up appointment in 6 months or earlier if she has any concerns.   Medication Adjustments/Labs and Tests Ordered: Current medicines are reviewed at length with the patient today.  Concerns regarding medicines are outlined above.  No orders of the defined types were placed in this encounter.  No orders of the defined types were placed in this encounter.    No chief complaint on file.    History of Present Illness:    Kathleen Hahn is a 43 y.o. female.  Patient has past medical history of pulmonary embolism.  She is morbidly obese and has history of dyslipidemia.  She was evaluated for dyspnea on exertion and has an abnormal stress test.  This is  mildly abnormal and low risk.  She denies any chest pain orthopnea or PND but continues to have dyspnea on exertion.  At the time of my evaluation, the patient is alert awake oriented and in no distress.  Past Medical History:  Diagnosis Date  . Anxiety   . Arthritis   . Bipolar 2 disorder ([place])   . Insomnia   . Lupus ([place])   . Pulmonary embolism ([place])     No past surgical history on file.  Current Medications: No outpatient medications have been marked as taking for the 02/28/19 encounter (Appointment) with [place] Wanner, Reita Cliche, MD.     Allergies:   Penicillin g   Social History   Socioeconomic History  . Marital status: Unknown    Spouse name: Not on file  . Number of children: Not on file  . Years of education: Not on file  . Highest education level: Not on file  Occupational History  . Not on file  Social Needs  . Financial resource strain: Not on file  . Food insecurity    Worry: Not on file    Inability: Not on file  . Transportation needs    Medical: Not on file    Non-medical: Not on file  Tobacco Use  . Smoking status: Former Research scientist (life sciences)  . Smokeless tobacco: Never Used  Substance and Sexual Activity  . Alcohol use: Not  on file  . Drug use: Not on file  . Sexual activity: Not on file  Lifestyle  . Physical activity    Days per week: Not on file    Minutes per session: Not on file  . Stress: Not on file  Relationships  . Social Musician on phone: Not on file    Gets together: Not on file    Attends religious service: Not on file    Active member of club or organization: Not on file    Attends meetings of clubs or organizations: Not on file    Relationship status: Not on file  Other Topics Concern  . Not on file  Social History Narrative  . Not on file     Family History: The patient's family history is not on file.  ROS:   Please see the history of present illness.    All other systems reviewed and are negative.  EKGs/Labs/Other  Studies Reviewed:    The following studies were reviewed today: Study Highlights   The left ventricular ejection fraction is normal (55-65%).  Nuclear stress EF: 57%.  There was no ST segment deviation noted during stress.  T wave inversion was noted during stress in the aVL and V2 leads, beginning at 1 minutes of stress, ending at 1 minutes of stress.  Defect 1: There is a small defect present in the apex location. This defect is likely apical thining.  Defect 2: There is a small defect of moderate severity present in the apical lateral location. This defect is reversible.  Findings consistent with ischemia.  This is an intermediate risk study.        Recent Labs: No results found for requested labs within last 8760 hours.  Recent Lipid Panel No results found for: CHOL, TRIG, HDL, CHOLHDL, VLDL, LDLCALC, LDLDIRECT  Physical Exam:    VS:  There were no vitals taken for this visit.    Wt Readings from Last 3 Encounters:  02/15/19 256 lb (116.1 kg)  01/07/19 256 lb (116.1 kg)  07/02/18 252 lb (114.3 kg)     GEN: Patient is in no acute distress HEENT: Normal NECK: No JVD; No carotid bruits LYMPHATICS: No lymphadenopathy CARDIAC: Hear sounds regular, 2/6 systolic murmur at the apex. RESPIRATORY:  Clear to auscultation without rales, wheezing or rhonchi  ABDOMEN: Soft, non-tender, non-distended MUSCULOSKELETAL:  No edema; No deformity  SKIN: Warm and dry NEUROLOGIC:  Alert and oriented x 3 PSYCHIATRIC:  Normal affect   Signed, Garwin Brothers, MD  02/28/2019 9:00 AM    [facility]

## 2019-03-01 DIAGNOSIS — R399 Unspecified symptoms and signs involving the genitourinary system: Secondary | ICD-10-CM | POA: Diagnosis not present

## 2019-03-01 DIAGNOSIS — K589 Irritable bowel syndrome without diarrhea: Secondary | ICD-10-CM | POA: Diagnosis not present

## 2019-03-01 DIAGNOSIS — F319 Bipolar disorder, unspecified: Secondary | ICD-10-CM | POA: Diagnosis not present

## 2019-03-01 DIAGNOSIS — R7989 Other specified abnormal findings of blood chemistry: Secondary | ICD-10-CM | POA: Diagnosis not present

## 2019-03-01 DIAGNOSIS — I82409 Acute embolism and thrombosis of unspecified deep veins of unspecified lower extremity: Secondary | ICD-10-CM | POA: Diagnosis not present

## 2019-03-01 DIAGNOSIS — R5383 Other fatigue: Secondary | ICD-10-CM | POA: Diagnosis not present

## 2019-03-01 DIAGNOSIS — N200 Calculus of kidney: Secondary | ICD-10-CM | POA: Diagnosis not present

## 2019-03-04 ENCOUNTER — Ambulatory Visit (HOSPITAL_BASED_OUTPATIENT_CLINIC_OR_DEPARTMENT_OTHER)
Admission: RE | Admit: 2019-03-04 | Discharge: 2019-03-04 | Disposition: A | Discharge status: Home / Self Care | Payer: Medicare HMO | Source: Ambulatory Visit | Attending: Cardiology | Admitting: Cardiology

## 2019-03-04 ENCOUNTER — Other Ambulatory Visit: Payer: Self-pay

## 2019-03-04 DIAGNOSIS — Z86711 Personal history of pulmonary embolism: Secondary | ICD-10-CM | POA: Insufficient documentation

## 2019-03-04 DIAGNOSIS — R011 Cardiac murmur, unspecified: Secondary | ICD-10-CM

## 2019-03-04 NOTE — Progress Notes (Signed)
  Echocardiogram 2D Echocardiogram has been performed.  Kathleen Hahn 03/04/2019, 4:00 PM

## 2019-03-07 DIAGNOSIS — F329 Major depressive disorder, single episode, unspecified: Secondary | ICD-10-CM | POA: Diagnosis not present

## 2019-03-09 ENCOUNTER — Telehealth: Payer: Self-pay

## 2019-03-09 NOTE — Telephone Encounter (Signed)
Results relayed, copy sent to PCP 

## 2019-03-09 NOTE — Telephone Encounter (Signed)
-----   Message from Rajan R Revankar, MD sent at 03/07/2019 11:54 AM EST ----- The results of the study is unremarkable. Please inform patient. I will discuss in detail at next appointment. Cc  primary care/referring physician Rajan R Revankar, MD 03/07/2019 11:54 AM 

## 2019-03-14 DIAGNOSIS — R5383 Other fatigue: Secondary | ICD-10-CM | POA: Diagnosis not present

## 2019-03-14 DIAGNOSIS — R000 Tachycardia, unspecified: Secondary | ICD-10-CM | POA: Diagnosis not present

## 2019-03-23 DIAGNOSIS — F329 Major depressive disorder, single episode, unspecified: Secondary | ICD-10-CM | POA: Diagnosis not present

## 2019-03-25 DIAGNOSIS — R5383 Other fatigue: Secondary | ICD-10-CM | POA: Diagnosis not present

## 2019-03-25 DIAGNOSIS — Z23 Encounter for immunization: Secondary | ICD-10-CM | POA: Diagnosis not present

## 2019-03-25 DIAGNOSIS — R7989 Other specified abnormal findings of blood chemistry: Secondary | ICD-10-CM | POA: Diagnosis not present

## 2019-03-28 DIAGNOSIS — J431 Panlobular emphysema: Secondary | ICD-10-CM | POA: Diagnosis not present

## 2019-03-28 DIAGNOSIS — E782 Mixed hyperlipidemia: Secondary | ICD-10-CM | POA: Diagnosis not present

## 2019-03-28 DIAGNOSIS — Z86711 Personal history of pulmonary embolism: Secondary | ICD-10-CM | POA: Diagnosis not present

## 2019-03-28 DIAGNOSIS — Z86718 Personal history of other venous thrombosis and embolism: Secondary | ICD-10-CM | POA: Diagnosis not present

## 2019-03-28 DIAGNOSIS — R931 Abnormal findings on diagnostic imaging of heart and coronary circulation: Secondary | ICD-10-CM | POA: Diagnosis not present

## 2019-03-28 DIAGNOSIS — R946 Abnormal results of thyroid function studies: Secondary | ICD-10-CM | POA: Diagnosis not present

## 2019-03-29 LAB — CBC
Hematocrit: 40.1 % (ref 34.0–46.6)
Hemoglobin: 13.6 g/dL (ref 11.1–15.9)
MCH: 29 pg (ref 26.6–33.0)
MCHC: 33.9 g/dL (ref 31.5–35.7)
MCV: 86 fL (ref 79–97)
Platelets: 322 10*3/uL (ref 150–450)
RBC: 4.69 x10E6/uL (ref 3.77–5.28)
RDW: 13.9 % (ref 11.7–15.4)
WBC: 9.8 10*3/uL (ref 3.4–10.8)

## 2019-03-29 LAB — BASIC METABOLIC PANEL
BUN/Creatinine Ratio: 18 (ref 9–23)
BUN: 13 mg/dL (ref 6–24)
CO2: 19 mmol/L — ABNORMAL LOW (ref 20–29)
Calcium: 9.5 mg/dL (ref 8.7–10.2)
Chloride: 103 mmol/L (ref 96–106)
Creatinine, Ser: 0.74 mg/dL (ref 0.57–1.00)
GFR calc Af Amer: 114 mL/min/{1.73_m2} (ref >59)
GFR calc non Af Amer: 99 mL/min/{1.73_m2} (ref >59)
Glucose: 88 mg/dL (ref 65–99)
Potassium: 4.6 mmol/L (ref 3.5–5.2)
Sodium: 138 mmol/L (ref 134–144)

## 2019-03-29 LAB — HEPATIC FUNCTION PANEL
ALT: 208 IU/L — ABNORMAL HIGH (ref 0–32)
AST: 87 IU/L — ABNORMAL HIGH (ref 0–40)
Albumin: 3.9 g/dL (ref 3.8–4.8)
Alkaline Phosphatase: 625 IU/L — ABNORMAL HIGH (ref 39–117)
Bilirubin Total: 0.6 mg/dL (ref 0.0–1.2)
Bilirubin, Direct: 0.29 mg/dL (ref 0.00–0.40)
Total Protein: 6.6 g/dL (ref 6.0–8.5)

## 2019-03-29 LAB — LIPID PANEL
Chol/HDL Ratio: 6.5 ratio — ABNORMAL HIGH (ref 0.0–4.4)
Cholesterol, Total: 340 mg/dL — ABNORMAL HIGH (ref 100–199)
HDL: 52 mg/dL (ref >39)
LDL Chol Calc (NIH): 263 mg/dL — ABNORMAL HIGH (ref 0–99)
Triglycerides: 133 mg/dL (ref 0–149)
VLDL Cholesterol Cal: 25 mg/dL (ref 5–40)

## 2019-03-29 LAB — TSH: TSH: 0.771 u[IU]/mL (ref 0.450–4.500)

## 2019-04-06 ENCOUNTER — Telehealth: Payer: Self-pay

## 2019-04-06 ENCOUNTER — Telehealth (HOSPITAL_COMMUNITY): Payer: Self-pay | Admitting: Emergency Medicine

## 2019-04-06 DIAGNOSIS — F329 Major depressive disorder, single episode, unspecified: Secondary | ICD-10-CM | POA: Diagnosis not present

## 2019-04-06 NOTE — Telephone Encounter (Signed)
Reaching out to patient to offer assistance regarding upcoming cardiac imaging study; pt verbalizes understanding of appt date/time, parking situation and where to check in, pre-test NPO status and medications ordered, and verified current allergies; name and call back number provided for further questions should they arise Marchia Bond RN [facility] [phone] office [phone] cell  Pt states she was taken off metoprolol as they were not able to control her low BPs

## 2019-04-06 NOTE — Telephone Encounter (Signed)
Left message for patient to return call. Copy of results sent to Dr. Lemmie Evens office. Also left message for Dr. Lemmie Evens nurse that results were sent and there are issues that need to be addressed.

## 2019-04-06 NOTE — Telephone Encounter (Signed)
-----   Message from Jenean Lindau, MD sent at 03/29/2019  8:19 AM EST ----- Her cholesterol is very high and we will attend to this.  But her liver function tests are extremely high.  Please call her primary care physician Dr. Juleen China and let them manage this.  Let the patient know about these results and that she needs to address this with primary care physician.  Send copy to primary care.  ASAP. Jenean Lindau, MD 03/29/2019 8:19 AM

## 2019-04-07 ENCOUNTER — Other Ambulatory Visit: Payer: Self-pay

## 2019-04-07 ENCOUNTER — Ambulatory Visit (HOSPITAL_COMMUNITY)
Admission: RE | Admit: 2019-04-07 | Discharge: 2019-04-07 | Disposition: A | Discharge status: Home / Self Care | Payer: Medicare HMO | Source: Ambulatory Visit | Attending: Cardiology | Admitting: Cardiology

## 2019-04-07 DIAGNOSIS — R9431 Abnormal electrocardiogram [ECG] [EKG]: Secondary | ICD-10-CM | POA: Diagnosis not present

## 2019-04-07 MED ORDER — METOPROLOL TARTRATE 5 MG/5ML IV SOLN
5.0000 mg | INTRAVENOUS | Status: DC | PRN
  Administered 2019-04-07: 17:00:00 5 mg via INTRAVENOUS

## 2019-04-07 MED ORDER — NITROGLYCERIN 0.4 MG SL SUBL: 0.8000 mg | SUBLINGUAL_TABLET | Freq: Once | SUBLINGUAL | Status: DC

## 2019-04-07 MED ORDER — METOPROLOL TARTRATE 5 MG/5ML IV SOLN
INTRAVENOUS | Status: AC
  Filled 2019-04-07: qty 10

## 2019-04-07 MED ORDER — NITROGLYCERIN 0.4 MG SL SUBL
SUBLINGUAL_TABLET | SUBLINGUAL | Status: AC
  Filled 2019-04-07: qty 2

## 2019-04-07 MED ORDER — IOHEXOL 350 MG/ML SOLN
100.0000 mL | Freq: Once | INTRAVENOUS | Status: AC | PRN
  Administered 2019-04-07: 17:00:00 100 mL via INTRAVENOUS

## 2019-04-08 DIAGNOSIS — R7989 Other specified abnormal findings of blood chemistry: Secondary | ICD-10-CM | POA: Diagnosis not present

## 2019-04-12 ENCOUNTER — Telehealth: Payer: Self-pay | Admitting: Cardiology

## 2019-04-12 NOTE — Telephone Encounter (Signed)
Patient needs a note for work from her test last week

## 2019-04-13 ENCOUNTER — Telehealth: Payer: Self-pay

## 2019-04-13 DIAGNOSIS — R9431 Abnormal electrocardiogram [ECG] [EKG]: Secondary | ICD-10-CM

## 2019-04-13 NOTE — Telephone Encounter (Signed)
Results for labs and CT relayed. Orders for cardiac MRI placed. Patient will schedule f/u appt with Dr. Docia Furl after her cardiac MRI is scheduled.

## 2019-04-13 NOTE — Telephone Encounter (Signed)
Patient would like to know if she needs to start back on metoprolol. She has been very fatigued and has no energy since stopping medication.

## 2019-04-13 NOTE — Telephone Encounter (Signed)
-----   Message from Jenean Lindau, MD sent at 04/12/2019  8:49 AM EST ----- Patient has minimal coronary artery disease.  We will discuss this with her and her appointment.  Meanwhile because of the increased thickness of the septum cardiac MRI has been recommended.  This is for hypertrophic cardiomyopathy diagnosis or to rule it out.  Please inform patient and schedule this cardiac MRI. Jenean Lindau, MD 04/12/2019 8:48 AM

## 2019-04-14 NOTE — Telephone Encounter (Signed)
Yes she can. Same dose

## 2019-04-15 ENCOUNTER — Telehealth: Payer: Self-pay | Admitting: *Deleted

## 2019-04-15 ENCOUNTER — Encounter: Payer: Self-pay | Admitting: Cardiology

## 2019-04-15 ENCOUNTER — Encounter: Payer: Self-pay | Admitting: Family

## 2019-04-15 NOTE — Telephone Encounter (Signed)
Left message for patient regarding appointment for cardiac mri scheduled 05/11/19 at 3:00pm at [facility]---arrival time 2:15pm 1st floor admissions office.  Will also mail information to patient

## 2019-04-15 NOTE — Telephone Encounter (Signed)
Patient informed she can start her metoprolol back per Dr. Geraldo Pitter. Patient verbally understood no further questions.

## 2019-04-15 NOTE — Telephone Encounter (Signed)
Called patient. Work note printed and mailed to patient per her request.  Loel Dubonnet, NP

## 2019-04-20 DIAGNOSIS — F329 Major depressive disorder, single episode, unspecified: Secondary | ICD-10-CM | POA: Diagnosis not present

## 2019-05-11 ENCOUNTER — Ambulatory Visit (HOSPITAL_COMMUNITY): Admission: RE | Admit: 2019-05-11 | Payer: Medicare HMO | Source: Ambulatory Visit

## 2019-05-11 DIAGNOSIS — F329 Major depressive disorder, single episode, unspecified: Secondary | ICD-10-CM | POA: Diagnosis not present

## 2019-05-18 DIAGNOSIS — F329 Major depressive disorder, single episode, unspecified: Secondary | ICD-10-CM | POA: Diagnosis not present

## 2019-05-19 ENCOUNTER — Other Ambulatory Visit: Payer: Self-pay | Admitting: Cardiology

## 2019-05-25 DIAGNOSIS — F329 Major depressive disorder, single episode, unspecified: Secondary | ICD-10-CM | POA: Diagnosis not present

## 2019-05-26 ENCOUNTER — Telehealth (HOSPITAL_COMMUNITY): Payer: Self-pay | Admitting: Emergency Medicine

## 2019-05-26 NOTE — Telephone Encounter (Signed)
Reaching out to patient to offer assistance regarding upcoming cardiac imaging study; pt verbalizes understanding of appt date/time, parking situation and where to check in, and verified current allergies; name and call back number provided for further questions should they arise Kathleen Alexandria RN Navigator Cardiac Imaging [facility] [phone] office [phone] cell  Denies claustrophobia states she has daith (ear) piercings that are difficult to remove. States has had MRI with them in the past with tape covering them.

## 2019-05-27 ENCOUNTER — Other Ambulatory Visit: Payer: Self-pay

## 2019-05-27 ENCOUNTER — Ambulatory Visit (HOSPITAL_COMMUNITY)
Admission: RE | Admit: 2019-05-27 | Discharge: 2019-05-27 | Disposition: A | Discharge status: Home / Self Care | Payer: Medicare HMO | Source: Ambulatory Visit | Attending: Cardiology | Admitting: Cardiology

## 2019-05-27 DIAGNOSIS — R9431 Abnormal electrocardiogram [ECG] [EKG]: Secondary | ICD-10-CM | POA: Diagnosis not present

## 2019-05-27 MED ORDER — GADOBUTROL 1 MMOL/ML IV SOLN
12.0000 mL | Freq: Once | INTRAVENOUS | Status: AC | PRN
  Administered 2019-05-27: 12 mL via INTRAVENOUS

## 2019-06-01 DIAGNOSIS — F329 Major depressive disorder, single episode, unspecified: Secondary | ICD-10-CM | POA: Diagnosis not present

## 2019-06-02 ENCOUNTER — Telehealth: Payer: Self-pay

## 2019-06-02 NOTE — Telephone Encounter (Signed)
Results relayed, copy sent to Dr. Rexene Edison.

## 2019-06-02 NOTE — Telephone Encounter (Signed)
-----   Message from Garwin Brothers, MD sent at 05/30/2019 11:56 AM EST ----- The results of the study is unremarkable. Please inform patient. I will discuss in detail at next appointment. Cc  primary care/referring physician Garwin Brothers, MD 05/30/2019 11:56 AM

## 2019-06-06 DIAGNOSIS — F329 Major depressive disorder, single episode, unspecified: Secondary | ICD-10-CM | POA: Diagnosis not present

## 2019-06-08 DIAGNOSIS — F329 Major depressive disorder, single episode, unspecified: Secondary | ICD-10-CM | POA: Diagnosis not present

## 2019-06-15 DIAGNOSIS — F329 Major depressive disorder, single episode, unspecified: Secondary | ICD-10-CM | POA: Diagnosis not present

## 2019-06-27 DIAGNOSIS — Z79899 Other long term (current) drug therapy: Secondary | ICD-10-CM | POA: Insufficient documentation

## 2019-06-27 DIAGNOSIS — J449 Chronic obstructive pulmonary disease, unspecified: Secondary | ICD-10-CM | POA: Diagnosis not present

## 2019-06-27 DIAGNOSIS — M329 Systemic lupus erythematosus, unspecified: Secondary | ICD-10-CM | POA: Diagnosis not present

## 2019-06-27 HISTORY — DX: Other long term (current) drug therapy: Z79.899

## 2019-06-29 DIAGNOSIS — F329 Major depressive disorder, single episode, unspecified: Secondary | ICD-10-CM | POA: Diagnosis not present

## 2019-07-05 DIAGNOSIS — R6889 Other general symptoms and signs: Secondary | ICD-10-CM | POA: Diagnosis not present

## 2019-07-05 DIAGNOSIS — F319 Bipolar disorder, unspecified: Secondary | ICD-10-CM | POA: Diagnosis not present

## 2019-07-05 DIAGNOSIS — I82409 Acute embolism and thrombosis of unspecified deep veins of unspecified lower extremity: Secondary | ICD-10-CM | POA: Diagnosis not present

## 2019-07-05 DIAGNOSIS — D6859 Other primary thrombophilia: Secondary | ICD-10-CM | POA: Diagnosis not present

## 2019-07-05 DIAGNOSIS — R7989 Other specified abnormal findings of blood chemistry: Secondary | ICD-10-CM | POA: Diagnosis not present

## 2019-07-05 DIAGNOSIS — G894 Chronic pain syndrome: Secondary | ICD-10-CM | POA: Diagnosis not present

## 2019-07-05 DIAGNOSIS — F419 Anxiety disorder, unspecified: Secondary | ICD-10-CM | POA: Diagnosis not present

## 2019-07-05 DIAGNOSIS — F329 Major depressive disorder, single episode, unspecified: Secondary | ICD-10-CM | POA: Diagnosis not present

## 2019-07-06 DIAGNOSIS — F329 Major depressive disorder, single episode, unspecified: Secondary | ICD-10-CM | POA: Diagnosis not present

## 2019-07-12 ENCOUNTER — Encounter: Payer: Self-pay | Admitting: Cardiology

## 2019-07-12 ENCOUNTER — Ambulatory Visit (INDEPENDENT_AMBULATORY_CARE_PROVIDER_SITE_OTHER): Payer: Medicare HMO | Admitting: Cardiology

## 2019-07-12 ENCOUNTER — Other Ambulatory Visit: Payer: Self-pay

## 2019-07-12 ENCOUNTER — Encounter (INDEPENDENT_AMBULATORY_CARE_PROVIDER_SITE_OTHER): Payer: Self-pay

## 2019-07-12 VITALS — BP 120/88 | HR 85 | Ht 68.0 in | Wt 269.0 lb

## 2019-07-12 DIAGNOSIS — Z86711 Personal history of pulmonary embolism: Secondary | ICD-10-CM

## 2019-07-12 DIAGNOSIS — R931 Abnormal findings on diagnostic imaging of heart and coronary circulation: Secondary | ICD-10-CM

## 2019-07-12 DIAGNOSIS — I2510 Atherosclerotic heart disease of native coronary artery without angina pectoris: Secondary | ICD-10-CM

## 2019-07-12 DIAGNOSIS — E782 Mixed hyperlipidemia: Secondary | ICD-10-CM

## 2019-07-12 HISTORY — DX: Atherosclerotic heart disease of native coronary artery without angina pectoris: I25.10

## 2019-07-12 NOTE — Progress Notes (Signed)
Cardiology Office Note:    Date:  07/12/2019   ID:  Kathleen Hahn, DOB [birth year], MRN [id number]  PCP:  Mathews Robinsons., MD  Cardiologist:  Garwin Brothers, MD   Referring MD: Mathews Robinsons.*    ASSESSMENT:    1. Abnormal nuclear cardiac imaging test   2. History of pulmonary embolus (PE)   3. Mixed dyslipidemia   4. Atherosclerosis of native coronary artery of native heart without angina pectoris    PLAN:    In order of problems listed above:  1. Secondary prevention stressed to the patient.  Importance of compliance with diet and medication stressed and she vocalized understanding.  She has mild coronary arteriosclerosis with luminal irregularities on CT scan. 2. Morbid obesity: Diet was discussed weight reduction was stressed and importance of exercise stressed.  Risks of obesity explained and she vocalized understanding. 3. Mixed dyslipidemia: Diet was emphasized.  Patient cannot take lipid-lowering medications because of elevated LFTs.  This is monitored by her hepatologist. 4. History of DVT and pulmonary embolism: On anticoagulation followed by primary care physician.  Benefits and potential is explained and she vocalized understanding. 5. Patient will be seen in follow-up appointment in 6 months or earlier if the patient has any concerns    Medication Adjustments/Labs and Tests Ordered: Current medicines are reviewed at length with the patient today.  Concerns regarding medicines are outlined above.  No orders of the defined types were placed in this encounter.  No orders of the defined types were placed in this encounter.    Chief Complaint  Patient presents with  . Follow-up     History of Present Illness:    Kathleen Hahn is a 44 y.o. female.  Patient has past medical history of nonobstructive coronary artery disease and atherosclerosis which is mild.  History of DVT and pulmonary embolism.  She denies any problems at this time and takes care of  activities of daily living.  She is morbidly obese.  No chest pain orthopnea or PND.  She walks on a regular basis.  At the time of my evaluation, the patient is alert awake oriented and in no distress.  Past Medical History:  Diagnosis Date  . Anxiety   . Arthritis   . Bipolar 2 disorder ([facility])   . Insomnia   . Lupus ([facility])   . Pulmonary embolism ([facility])     History reviewed. No pertinent surgical history.  Current Medications: Current Meds  Medication Sig  . albuterol (PROAIR HFA) 108 (90 Base) MCG/ACT inhaler INHALE 2 PUFFS BY MOUTH EVERY 4 TO 6 HOURS AS NEEDED  . apixaban (ELIQUIS) 5 MG TABS tablet Take 1 tablet by mouth 2 (two) times daily.  Marland Kitchen aspirin EC 81 MG tablet Take 1 tablet (81 mg total) by mouth daily.  . budesonide-formoterol (SYMBICORT) 160-4.5 MCG/ACT inhaler INHALE 2 PUFFS BY MOUTH TWICE DAILY AS NEEDED  . celecoxib (CELEBREX) 200 MG capsule Take 200 mg by mouth daily.  . Cholecalciferol (VITAMIN D3) 1.25 MG (50000 UT) CAPS Take 1 capsule by mouth once a week.  . diltiazem (CARDIZEM CD) 180 MG 24 hr capsule   . doxepin (SINEQUAN) 50 MG capsule   . hydroxychloroquine (PLAQUENIL) 200 MG tablet Take 1 tablet by mouth 2 (two) times daily.  . hydrOXYzine (ATARAX/VISTARIL) 50 MG tablet Take by mouth.  . metoprolol tartrate (LOPRESSOR) 50 MG tablet TAKE 1 TABLET BY MOUTH TWICE A DAY  . nitroGLYCERIN (NITROSTAT) 0.4 MG SL tablet Place 1 tablet (0.4  mg total) under the tongue every 5 (five) minutes as needed.  Marland Kitchen oxybutynin (DITROPAN) 5 MG tablet Take 10 mg by mouth 3 (three) times daily.   . valACYclovir (VALTREX) 500 MG tablet TAKE 4 TABLETS NOW, 4 TABLETS IN 12 HOURS. MAY REPEAT FOR NEW OUTBREAKS  . VRAYLAR capsule Take 3 mg by mouth daily.  . [DISCONTINUED] hydrOXYzine (VISTARIL) 25 MG capsule      Allergies:   Penicillin g   Social History   Socioeconomic History  . Marital status: Unknown    Spouse name: Not on file  . Number of children: Not on file  . Years of  education: Not on file  . Highest education level: Not on file  Occupational History  . Not on file  Tobacco Use  . Smoking status: Former Games developer  . Smokeless tobacco: Never Used  Substance and Sexual Activity  . Alcohol use: Not on file  . Drug use: Not on file  . Sexual activity: Not on file  Other Topics Concern  . Not on file  Social History Narrative  . Not on file   Social Determinants of Health   Financial Resource Strain:   . Difficulty of Paying Living Expenses: Not on file  Food Insecurity:   . Worried About Programme researcher, broadcasting/film/video in the Last Year: Not on file  . Ran Out of Food in the Last Year: Not on file  Transportation Needs:   . Lack of Transportation (Medical): Not on file  . Lack of Transportation (Non-Medical): Not on file  Physical Activity:   . Days of Exercise per Week: Not on file  . Minutes of Exercise per Session: Not on file  Stress:   . Feeling of Stress : Not on file  Social Connections:   . Frequency of Communication with Friends and Family: Not on file  . Frequency of Social Gatherings with Friends and Family: Not on file  . Attends Religious Services: Not on file  . Active Member of Clubs or Organizations: Not on file  . Attends Banker Meetings: Not on file  . Marital Status: Not on file     Family History: The patient's family history is not on file.  ROS:   Please see the history of present illness.    All other systems reviewed and are negative.  EKGs/Labs/Other Studies Reviewed:    The following studies were reviewed today: HISTORY: ECG abnormal or other ischemic symptoms, intermediate/high prob abn nuc  EXAM: Cardiac/Coronary  CT  TECHNIQUE: The patient was scanned on a [employer].  PROTOCOL: A 120 kV prospective scan was triggered in the descending thoracic aorta at 111 HU's. Axial non-contrast 3 mm slices were carried out through the heart. The data set was analyzed on a dedicated  work station and scored using the Agatson method. Gantry rotation speed was 250 msecs and collimation was .6 mm. Beta blockade and 0.8 mg of sl NTG was given. The 3D data set was reconstructed in 5% intervals of the 67-82 % of the R-R cycle. Diastolic phases were analyzed on a dedicated work station using MPR, MIP and VRT modes. The patient received OMNIPAQUE IOHEXOL 350 MG/ML SOLN of contrast.  FINDINGS: Coronary calcium score: The patient's coronary artery calcium score is 1, which places the patient in the 93 percentile.  Coronary arteries: Normal coronary origins.  Right dominance.  Right Coronary Artery: No detectable plaque or stenosis.  Left Main Coronary Artery: Minimal atherosclerotic plaque in the  mid LAD, <25% stenosis.  Left Anterior Descending Coronary Artery: Minimal atherosclerotic plaque in the mid LAD, <25% stenosis. No myocardial bridging noted.  Left Circumflex Artery: No detectable plaque or stenosis.  Aorta: Normal size, 30 mm at the mid ascending aorta (level of the PA bifurcation) measured double oblique. No calcifications. No dissection.  Aortic Valve: No calcifications.  Other findings:  Normal pulmonary vein drainage into the left atrium.  Normal left atrial appendage without a thrombus.  Normal size of the pulmonary artery.  Asymmetric septal hypertrophy measuring 16 mm.  Mild biatrial chamber enlargement.  IMPRESSION: 1.  Minimal CAD, CADRADS = 1.  2. The patient's coronary artery calcium score is 1, which places the patient in the 93 percentile.  3. Normal coronary origin with right dominance.  4. Asymmetric basal septal hypertrophy measuring 16 mm. Consider cardiac MRI for evaluation of hypertrophic cardiomyopathy.   Electronically Signed   By: Cherlynn Kaiser   On: 04/10/2019 20:36   Recent Labs: 03/28/2019: ALT 208; BUN 13; Creatinine, Ser 0.74; Hemoglobin 13.6; Platelets 322; Potassium 4.6; Sodium  138; TSH 0.771  Recent Lipid Panel    Component Value Date/Time   CHOL 340 (H) 03/28/2019 0921   TRIG 133 03/28/2019 0921   HDL 52 03/28/2019 0921   CHOLHDL 6.5 (H) 03/28/2019 0921   LDLCALC 263 (H) 03/28/2019 0921    Physical Exam:    VS:  BP 120/88   Pulse 85   Ht 5\' 8"  (1.727 m)   Wt 269 lb (122 kg)   SpO2 98%   BMI 40.90 kg/m     Wt Readings from Last 3 Encounters:  07/12/19 269 lb (122 kg)  02/28/19 255 lb 1.3 oz (115.7 kg)  02/15/19 256 lb (116.1 kg)     GEN: Patient is in no acute distress HEENT: Normal NECK: No JVD; No carotid bruits LYMPHATICS: No lymphadenopathy CARDIAC: Hear sounds regular, 2/6 systolic murmur at the apex. RESPIRATORY:  Clear to auscultation without rales, wheezing or rhonchi  ABDOMEN: Soft, non-tender, non-distended MUSCULOSKELETAL:  No edema; No deformity  SKIN: Warm and dry NEUROLOGIC:  Alert and oriented x 3 PSYCHIATRIC:  Normal affect   Signed, Jenean Lindau, MD  07/12/2019 1:53 PM    [facility]

## 2019-07-12 NOTE — Patient Instructions (Signed)

## 2019-07-13 DIAGNOSIS — R399 Unspecified symptoms and signs involving the genitourinary system: Secondary | ICD-10-CM | POA: Diagnosis not present

## 2019-07-13 DIAGNOSIS — E669 Obesity, unspecified: Secondary | ICD-10-CM | POA: Diagnosis not present

## 2019-07-13 DIAGNOSIS — Z1211 Encounter for screening for malignant neoplasm of colon: Secondary | ICD-10-CM | POA: Diagnosis not present

## 2019-07-13 DIAGNOSIS — R197 Diarrhea, unspecified: Secondary | ICD-10-CM | POA: Diagnosis not present

## 2019-07-13 DIAGNOSIS — R7989 Other specified abnormal findings of blood chemistry: Secondary | ICD-10-CM | POA: Diagnosis not present

## 2019-07-13 DIAGNOSIS — K5790 Diverticulosis of intestine, part unspecified, without perforation or abscess without bleeding: Secondary | ICD-10-CM | POA: Diagnosis not present

## 2019-07-20 DIAGNOSIS — F329 Major depressive disorder, single episode, unspecified: Secondary | ICD-10-CM | POA: Diagnosis not present

## 2019-07-27 DIAGNOSIS — F329 Major depressive disorder, single episode, unspecified: Secondary | ICD-10-CM | POA: Diagnosis not present

## 2019-08-09 DIAGNOSIS — M1711 Unilateral primary osteoarthritis, right knee: Secondary | ICD-10-CM | POA: Diagnosis not present

## 2019-08-17 DIAGNOSIS — F329 Major depressive disorder, single episode, unspecified: Secondary | ICD-10-CM | POA: Diagnosis not present

## 2019-08-24 DIAGNOSIS — F329 Major depressive disorder, single episode, unspecified: Secondary | ICD-10-CM | POA: Diagnosis not present

## 2019-08-29 DIAGNOSIS — F329 Major depressive disorder, single episode, unspecified: Secondary | ICD-10-CM | POA: Diagnosis not present

## 2019-08-31 DIAGNOSIS — F329 Major depressive disorder, single episode, unspecified: Secondary | ICD-10-CM | POA: Diagnosis not present

## 2019-09-07 DIAGNOSIS — F329 Major depressive disorder, single episode, unspecified: Secondary | ICD-10-CM | POA: Diagnosis not present

## 2019-09-21 DIAGNOSIS — F329 Major depressive disorder, single episode, unspecified: Secondary | ICD-10-CM | POA: Diagnosis not present

## 2019-09-28 DIAGNOSIS — F329 Major depressive disorder, single episode, unspecified: Secondary | ICD-10-CM | POA: Diagnosis not present

## 2019-10-05 DIAGNOSIS — Z5181 Encounter for therapeutic drug level monitoring: Secondary | ICD-10-CM | POA: Diagnosis not present

## 2019-10-05 DIAGNOSIS — M791 Myalgia, unspecified site: Secondary | ICD-10-CM | POA: Diagnosis not present

## 2019-10-05 DIAGNOSIS — M545 Low back pain: Secondary | ICD-10-CM | POA: Diagnosis not present

## 2019-10-05 DIAGNOSIS — Z1330 Encounter for screening examination for mental health and behavioral disorders, unspecified: Secondary | ICD-10-CM | POA: Diagnosis not present

## 2019-10-05 DIAGNOSIS — Z1389 Encounter for screening for other disorder: Secondary | ICD-10-CM | POA: Diagnosis not present

## 2019-10-05 DIAGNOSIS — G8929 Other chronic pain: Secondary | ICD-10-CM | POA: Diagnosis not present

## 2019-10-05 DIAGNOSIS — G894 Chronic pain syndrome: Secondary | ICD-10-CM | POA: Diagnosis not present

## 2019-10-05 DIAGNOSIS — Z1331 Encounter for screening for depression: Secondary | ICD-10-CM | POA: Diagnosis not present

## 2019-10-06 DIAGNOSIS — F329 Major depressive disorder, single episode, unspecified: Secondary | ICD-10-CM | POA: Diagnosis not present

## 2019-10-12 DIAGNOSIS — F329 Major depressive disorder, single episode, unspecified: Secondary | ICD-10-CM | POA: Diagnosis not present

## 2019-10-17 DIAGNOSIS — Z6839 Body mass index (BMI) 39.0-39.9, adult: Secondary | ICD-10-CM | POA: Diagnosis not present

## 2019-10-17 DIAGNOSIS — F3160 Bipolar disorder, current episode mixed, unspecified: Secondary | ICD-10-CM | POA: Diagnosis not present

## 2019-10-17 DIAGNOSIS — H6121 Impacted cerumen, right ear: Secondary | ICD-10-CM | POA: Diagnosis not present

## 2019-10-17 DIAGNOSIS — M797 Fibromyalgia: Secondary | ICD-10-CM | POA: Diagnosis not present

## 2019-10-17 DIAGNOSIS — D6859 Other primary thrombophilia: Secondary | ICD-10-CM | POA: Diagnosis not present

## 2019-10-17 DIAGNOSIS — E669 Obesity, unspecified: Secondary | ICD-10-CM | POA: Diagnosis not present

## 2019-10-19 DIAGNOSIS — F329 Major depressive disorder, single episode, unspecified: Secondary | ICD-10-CM | POA: Diagnosis not present

## 2019-10-26 DIAGNOSIS — F329 Major depressive disorder, single episode, unspecified: Secondary | ICD-10-CM | POA: Diagnosis not present

## 2019-11-02 DIAGNOSIS — F329 Major depressive disorder, single episode, unspecified: Secondary | ICD-10-CM | POA: Diagnosis not present

## 2019-11-06 ENCOUNTER — Other Ambulatory Visit: Payer: Self-pay | Admitting: Cardiology

## 2019-11-16 DIAGNOSIS — F329 Major depressive disorder, single episode, unspecified: Secondary | ICD-10-CM | POA: Diagnosis not present

## 2019-11-17 DIAGNOSIS — H43393 Other vitreous opacities, bilateral: Secondary | ICD-10-CM | POA: Diagnosis not present

## 2019-11-17 DIAGNOSIS — H16223 Keratoconjunctivitis sicca, not specified as Sjogren's, bilateral: Secondary | ICD-10-CM | POA: Diagnosis not present

## 2019-11-17 DIAGNOSIS — Z79899 Other long term (current) drug therapy: Secondary | ICD-10-CM | POA: Diagnosis not present

## 2019-11-17 DIAGNOSIS — M328 Other forms of systemic lupus erythematosus: Secondary | ICD-10-CM | POA: Diagnosis not present

## 2019-11-23 DIAGNOSIS — F329 Major depressive disorder, single episode, unspecified: Secondary | ICD-10-CM | POA: Diagnosis not present

## 2019-11-30 DIAGNOSIS — M797 Fibromyalgia: Secondary | ICD-10-CM | POA: Diagnosis not present

## 2019-11-30 DIAGNOSIS — M5416 Radiculopathy, lumbar region: Secondary | ICD-10-CM | POA: Diagnosis not present

## 2019-11-30 DIAGNOSIS — G894 Chronic pain syndrome: Secondary | ICD-10-CM | POA: Diagnosis not present

## 2019-11-30 DIAGNOSIS — G8929 Other chronic pain: Secondary | ICD-10-CM | POA: Diagnosis not present

## 2019-11-30 DIAGNOSIS — M545 Low back pain: Secondary | ICD-10-CM | POA: Diagnosis not present

## 2019-12-01 DIAGNOSIS — F329 Major depressive disorder, single episode, unspecified: Secondary | ICD-10-CM | POA: Diagnosis not present

## 2019-12-05 ENCOUNTER — Telehealth: Payer: Self-pay | Admitting: Cardiology

## 2019-12-05 DIAGNOSIS — M329 Systemic lupus erythematosus, unspecified: Secondary | ICD-10-CM | POA: Diagnosis not present

## 2019-12-05 DIAGNOSIS — Z86718 Personal history of other venous thrombosis and embolism: Secondary | ICD-10-CM | POA: Diagnosis not present

## 2019-12-05 DIAGNOSIS — Z7901 Long term (current) use of anticoagulants: Secondary | ICD-10-CM

## 2019-12-05 DIAGNOSIS — Z86711 Personal history of pulmonary embolism: Secondary | ICD-10-CM | POA: Diagnosis not present

## 2019-12-05 HISTORY — DX: Long term (current) use of anticoagulants: Z79.01

## 2019-12-05 NOTE — Telephone Encounter (Signed)
Pt c/o Shortness Of Breath: STAT if SOB developed within the last 24 hours or pt is noticeably SOB on the phone  1. Are you currently SOB (can you hear that pt is SOB on the phone)? no  2. How long have you been experiencing SOB? ~3 weeks   3. Are you SOB when sitting or when up moving around? Pt wakes up SOB and is SOB for ~ an hour before she can seem to catch her breath  4. Are you currently experiencing any other symptoms? No  Patient was hoping to get in to see Dr. Tomie China in [place] sooner than her appt she is currently scheduled for

## 2019-12-05 NOTE — Telephone Encounter (Signed)
Pt given the recommendation as per Dr. Tomie China. Pt verbalized understanding and had no additional questions.

## 2019-12-05 NOTE — Telephone Encounter (Signed)
She needs to go to emergency room or urgent care ASAP for evaluation.

## 2019-12-07 DIAGNOSIS — F329 Major depressive disorder, single episode, unspecified: Secondary | ICD-10-CM | POA: Diagnosis not present

## 2019-12-14 DIAGNOSIS — F329 Major depressive disorder, single episode, unspecified: Secondary | ICD-10-CM | POA: Diagnosis not present

## 2019-12-28 DIAGNOSIS — F329 Major depressive disorder, single episode, unspecified: Secondary | ICD-10-CM | POA: Diagnosis not present

## 2019-12-29 DIAGNOSIS — F329 Major depressive disorder, single episode, unspecified: Secondary | ICD-10-CM | POA: Diagnosis not present

## 2020-01-04 DIAGNOSIS — F329 Major depressive disorder, single episode, unspecified: Secondary | ICD-10-CM | POA: Diagnosis not present

## 2020-01-10 ENCOUNTER — Ambulatory Visit: Payer: Medicare HMO | Admitting: Cardiology

## 2020-01-10 DIAGNOSIS — M329 Systemic lupus erythematosus, unspecified: Secondary | ICD-10-CM | POA: Diagnosis not present

## 2020-01-10 DIAGNOSIS — Z79899 Other long term (current) drug therapy: Secondary | ICD-10-CM | POA: Diagnosis not present

## 2020-01-11 DIAGNOSIS — F329 Major depressive disorder, single episode, unspecified: Secondary | ICD-10-CM | POA: Diagnosis not present

## 2020-01-12 ENCOUNTER — Ambulatory Visit (INDEPENDENT_AMBULATORY_CARE_PROVIDER_SITE_OTHER): Payer: Medicare HMO | Admitting: Cardiology

## 2020-01-12 ENCOUNTER — Other Ambulatory Visit: Payer: Self-pay

## 2020-01-12 ENCOUNTER — Encounter: Payer: Self-pay | Admitting: Cardiology

## 2020-01-12 VITALS — BP 108/72 | HR 76 | Ht 68.0 in | Wt 262.4 lb

## 2020-01-12 DIAGNOSIS — I2510 Atherosclerotic heart disease of native coronary artery without angina pectoris: Secondary | ICD-10-CM

## 2020-01-12 DIAGNOSIS — Z86711 Personal history of pulmonary embolism: Secondary | ICD-10-CM

## 2020-01-12 DIAGNOSIS — E7849 Other hyperlipidemia: Secondary | ICD-10-CM

## 2020-01-12 DIAGNOSIS — E782 Mixed hyperlipidemia: Secondary | ICD-10-CM

## 2020-01-12 HISTORY — DX: Other hyperlipidemia: E78.49

## 2020-01-12 NOTE — Patient Instructions (Addendum)
Medication Instructions:  Your physician recommends that you continue on your current medications as directed. Please refer to the Current Medication list given to you today.  *If you need a refill on your cardiac medications before your next appointment, please call your pharmacy*   Lab Work: Your physician recommends that you return for a FASTING lipid profile, Hepatic Function test and Basic Metabolic Panel in a few days   If you have labs (blood work) drawn today and your tests are completely normal, you will receive your results only by: Marland Kitchen MyChart Message (if you have MyChart) OR . A paper copy in the mail If you have any lab test that is abnormal or we need to change your treatment, we will call you to review the results.   Testing/Procedures: Your physician has referred you to the Lipid Clinic   Follow-Up: At [facility], you and your health needs are our priority.  As part of our continuing mission to provide you with exceptional heart care, we have created designated Provider Care Teams.  These Care Teams include your primary Cardiologist (physician) and Advanced Practice Providers (APPs -  Physician Assistants and Nurse Practitioners) who all work together to provide you with the care you need, when you need it.  We recommend signing up for the patient portal called "MyChart".  Sign up information is provided on this After Visit Summary.  MyChart is used to connect with patients for Virtual Visits (Telemedicine).  Patients are able to view lab/test results, encounter notes, upcoming appointments, etc.  Non-urgent messages can be sent to your provider as well.   To learn more about what you can do with MyChart, go to [email or web address].    Your next appointment:   6 month(s)  The format for your next appointment:   In Person  Provider:   Belva Crome, MD   Other Instructions None

## 2020-01-12 NOTE — Progress Notes (Signed)
Cardiology Office Note:    Date:  01/12/2020   ID:  Kathleen Hahn, DOB [birth year], MRN [id number]  PCP:  Mathews Robinsons., MD  Cardiologist:  Garwin Brothers, MD   Referring MD: Mathews Robinsons.*    ASSESSMENT:    1. Atherosclerosis of native coronary artery of native heart without angina pectoris   2. History of pulmonary embolus (PE)   3. Mixed dyslipidemia   4. Familial hyperlipidemia    PLAN:    In order of problems listed above:  1. Coronary artery disease: Secondary prevention stressed with the patient.  Importance of compliance with diet medication stressed and she vocalized understanding. 2. History of pulmonary embolism: On anticoagulation.  Benefits and risks explained and she vocalized understanding. 3. Essential hypertension: Blood pressure stable. 4. Family hyperlipidemia: Diet was emphasized.  Weight reduction was stressed and she promises to do better.  I will refer her to the lipid clinic for this.  She is not on statins because of elevated LFTs.  She will be back in the next few days for blood work again which would include Chem-7 liver lipid check. 5. Patient will be seen in follow-up appointment in 6 months or earlier if the patient has any concerns    Medication Adjustments/Labs and Tests Ordered: Current medicines are reviewed at length with the patient today.  Concerns regarding medicines are outlined above.  No orders of the defined types were placed in this encounter.  No orders of the defined types were placed in this encounter.    No chief complaint on file.    History of Present Illness:    Kathleen Hahn is a 44 y.o. female.  Patient has past medical history of lupus, pulmonary embolism, nonobstructive coronary artery disease and dyslipidemia.  She has familial hyperlipidemia.  She denies any problems at this time.  She leads a sedentary lifestyle because of lupus issues.  No chest pain orthopnea or PND.  At the time of my evaluation, the  patient is alert awake oriented and in no distress.  Her LFTs are elevated.  Past Medical History:  Diagnosis Date  . Anxiety   . Arthritis   . Bipolar 2 disorder (HCC)   . Insomnia   . Lupus (HCC)   . Pulmonary embolism (HCC)     History reviewed. No pertinent surgical history.  Current Medications: Current Meds  Medication Sig  . albuterol (PROAIR HFA) 108 (90 Base) MCG/ACT inhaler INHALE 2 PUFFS BY MOUTH EVERY 4 TO 6 HOURS AS NEEDED  . apixaban (ELIQUIS) 5 MG TABS tablet Take 1 tablet by mouth 2 (two) times daily.   Marland Kitchen aspirin EC 81 MG tablet Take 1 tablet (81 mg total) by mouth daily.  . budesonide-formoterol (SYMBICORT) 160-4.5 MCG/ACT inhaler INHALE 2 PUFFS BY MOUTH TWICE DAILY AS NEEDED  . Cholecalciferol (VITAMIN D3) 1.25 MG (50000 UT) CAPS Take 1 capsule by mouth once a week.  . cholestyramine (QUESTRAN) 4 g packet Take 1 packet by mouth daily.  Marland Kitchen diltiazem (CARDIZEM CD) 180 MG 24 hr capsule Take 180 mg by mouth daily.   Marland Kitchen Hyaluronan 88 MG/4ML SOSY Inject into the articular space.  . hydroxychloroquine (PLAQUENIL) 200 MG tablet Take 1 tablet by mouth 2 (two) times daily.  . hydrOXYzine (VISTARIL) 25 MG capsule Take 25 mg by mouth in the morning, at noon, and at bedtime.  . metoprolol tartrate (LOPRESSOR) 50 MG tablet TAKE 1 TABLET BY MOUTH TWICE A DAY  . mirtazapine (REMERON) 7.5 MG tablet  Take 7.5 mg by mouth at bedtime.  . nitroGLYCERIN (NITROSTAT) 0.4 MG SL tablet Place 1 tablet (0.4 mg total) under the tongue every 5 (five) minutes as needed.  Marland Kitchen ofloxacin (FLOXIN) 0.3 % OTIC solution 5 drops 2 (two) times daily as needed.  . pregabalin (LYRICA) 100 MG capsule TAKE 1 (ONE) CAPSULE CAPSULE TWO TIMES DAILY  . sertraline (ZOLOFT) 25 MG tablet Take 25 mg by mouth daily.  Marland Kitchen VIBERZI 100 MG TABS Take 1 tablet by mouth 2 (two) times daily.  Marland Kitchen VRAYLAR capsule Take 3 mg by mouth daily.  . [DISCONTINUED] hydrOXYzine (ATARAX/VISTARIL) 50 MG tablet Take by mouth in the morning and at  bedtime.      Allergies:   Patient has no known allergies.   Social History   Socioeconomic History  . Marital status: Unknown    Spouse name: Not on file  . Number of children: Not on file  . Years of education: Not on file  . Highest education level: Not on file  Occupational History  . Not on file  Tobacco Use  . Smoking status: Former Games developer  . Smokeless tobacco: Never Used  Substance and Sexual Activity  . Alcohol use: Not on file  . Drug use: Not on file  . Sexual activity: Not on file  Other Topics Concern  . Not on file  Social History Narrative  . Not on file   Social Determinants of Health   Financial Resource Strain:   . Difficulty of Paying Living Expenses: Not on file  Food Insecurity:   . Worried About Programme researcher, broadcasting/film/video in the Last Year: Not on file  . Ran Out of Food in the Last Year: Not on file  Transportation Needs:   . Lack of Transportation (Medical): Not on file  . Lack of Transportation (Non-Medical): Not on file  Physical Activity:   . Days of Exercise per Week: Not on file  . Minutes of Exercise per Session: Not on file  Stress:   . Feeling of Stress : Not on file  Social Connections:   . Frequency of Communication with Friends and Family: Not on file  . Frequency of Social Gatherings with Friends and Family: Not on file  . Attends Religious Services: Not on file  . Active Member of Clubs or Organizations: Not on file  . Attends Banker Meetings: Not on file  . Marital Status: Not on file     Family History: The patient's family history includes Heart attack in her father; Heart disease in her father.  ROS:   Please see the history of present illness.    All other systems reviewed and are negative.  EKGs/Labs/Other Studies Reviewed:    The following studies were reviewed today: IMPRESSION: 1.  Minimal CAD, CADRADS = 1.  2. The patient's coronary artery calcium score is 1, which places the patient in the 93  percentile.  3. Normal coronary origin with right dominance.  4. Asymmetric basal septal hypertrophy measuring 16 mm. Consider cardiac MRI for evaluation of hypertrophic cardiomyopathy.   Electronically Signed   By: Weston Brass   On: 04/10/2019 20:36   Recent Labs: 03/28/2019: ALT 208; BUN 13; Creatinine, Ser 0.74; Hemoglobin 13.6; Platelets 322; Potassium 4.6; Sodium 138; TSH 0.771  Recent Lipid Panel    Component Value Date/Time   CHOL 340 (H) 03/28/2019 0921   TRIG 133 03/28/2019 0921   HDL 52 03/28/2019 0921   CHOLHDL 6.5 (H) 03/28/2019 8657  LDLCALC 263 (H) 03/28/2019 2023    Physical Exam:    VS:  BP 108/72   Pulse 76   Ht 5\' 8"  (1.727 m)   Wt 262 lb 6.4 oz (119 kg)   SpO2 96%   BMI 39.90 kg/m     Wt Readings from Last 3 Encounters:  01/12/20 262 lb 6.4 oz (119 kg)  07/12/19 269 lb (122 kg)  02/28/19 255 lb 1.3 oz (115.7 kg)     GEN: Patient is in no acute distress HEENT: Normal NECK: No JVD; No carotid bruits LYMPHATICS: No lymphadenopathy CARDIAC: Hear sounds regular, 2/6 systolic murmur at the apex. RESPIRATORY:  Clear to auscultation without rales, wheezing or rhonchi  ABDOMEN: Soft, non-tender, non-distended MUSCULOSKELETAL:  No edema; No deformity  SKIN: Warm and dry NEUROLOGIC:  Alert and oriented x 3 PSYCHIATRIC:  Normal affect   Signed, 03/02/19, MD  01/12/2020 3:40 PM    [facility]

## 2020-01-24 DIAGNOSIS — E7849 Other hyperlipidemia: Secondary | ICD-10-CM | POA: Diagnosis not present

## 2020-01-24 DIAGNOSIS — Z86711 Personal history of pulmonary embolism: Secondary | ICD-10-CM | POA: Diagnosis not present

## 2020-01-24 DIAGNOSIS — E782 Mixed hyperlipidemia: Secondary | ICD-10-CM | POA: Diagnosis not present

## 2020-01-25 DIAGNOSIS — Z0000 Encounter for general adult medical examination without abnormal findings: Secondary | ICD-10-CM | POA: Diagnosis not present

## 2020-01-25 DIAGNOSIS — M797 Fibromyalgia: Secondary | ICD-10-CM | POA: Diagnosis not present

## 2020-01-25 DIAGNOSIS — I201 Angina pectoris with documented spasm: Secondary | ICD-10-CM | POA: Diagnosis not present

## 2020-01-25 DIAGNOSIS — E669 Obesity, unspecified: Secondary | ICD-10-CM | POA: Diagnosis not present

## 2020-01-25 DIAGNOSIS — Z23 Encounter for immunization: Secondary | ICD-10-CM | POA: Diagnosis not present

## 2020-01-25 LAB — LIPID PANEL
Chol/HDL Ratio: 6 ratio — ABNORMAL HIGH (ref 0.0–4.4)
Cholesterol, Total: 284 mg/dL — ABNORMAL HIGH (ref 100–199)
HDL: 47 mg/dL (ref >39)
LDL Chol Calc (NIH): 200 mg/dL — ABNORMAL HIGH (ref 0–99)
Triglycerides: 196 mg/dL — ABNORMAL HIGH (ref 0–149)
VLDL Cholesterol Cal: 37 mg/dL (ref 5–40)

## 2020-01-25 LAB — HEPATIC FUNCTION PANEL
ALT: 65 IU/L — ABNORMAL HIGH (ref 0–32)
AST: 42 IU/L — ABNORMAL HIGH (ref 0–40)
Albumin: 4 g/dL (ref 3.8–4.8)
Alkaline Phosphatase: 215 IU/L — ABNORMAL HIGH (ref 44–121)
Bilirubin Total: 0.4 mg/dL (ref 0.0–1.2)
Bilirubin, Direct: 0.14 mg/dL (ref 0.00–0.40)
Total Protein: 6.7 g/dL (ref 6.0–8.5)

## 2020-01-25 LAB — BASIC METABOLIC PANEL
BUN/Creatinine Ratio: 14 (ref 9–23)
BUN: 10 mg/dL (ref 6–24)
CO2: 21 mmol/L (ref 20–29)
Calcium: 9.6 mg/dL (ref 8.7–10.2)
Chloride: 107 mmol/L — ABNORMAL HIGH (ref 96–106)
Creatinine, Ser: 0.73 mg/dL (ref 0.57–1.00)
GFR calc Af Amer: 115 mL/min/{1.73_m2} (ref >59)
GFR calc non Af Amer: 100 mL/min/{1.73_m2} (ref >59)
Glucose: 81 mg/dL (ref 65–99)
Potassium: 4.5 mmol/L (ref 3.5–5.2)
Sodium: 141 mmol/L (ref 134–144)

## 2020-01-26 ENCOUNTER — Telehealth: Payer: Self-pay

## 2020-01-26 DIAGNOSIS — F329 Major depressive disorder, single episode, unspecified: Secondary | ICD-10-CM | POA: Diagnosis not present

## 2020-01-26 NOTE — Telephone Encounter (Signed)
See result note.  

## 2020-01-26 NOTE — Telephone Encounter (Signed)
Follow Up:     Returning Kathleen Hahn's call from yesterday, concerning her lab results.

## 2020-02-01 DIAGNOSIS — F329 Major depressive disorder, single episode, unspecified: Secondary | ICD-10-CM | POA: Diagnosis not present

## 2020-03-06 ENCOUNTER — Telehealth (INDEPENDENT_AMBULATORY_CARE_PROVIDER_SITE_OTHER): Payer: Medicare HMO | Admitting: Internal Medicine

## 2020-03-06 ENCOUNTER — Encounter: Payer: Self-pay | Admitting: Internal Medicine

## 2020-03-06 VITALS — BP 133/65 | HR 82

## 2020-03-06 DIAGNOSIS — E785 Hyperlipidemia, unspecified: Secondary | ICD-10-CM

## 2020-03-06 DIAGNOSIS — E7849 Other hyperlipidemia: Secondary | ICD-10-CM | POA: Diagnosis not present

## 2020-03-06 DIAGNOSIS — K719 Toxic liver disease, unspecified: Secondary | ICD-10-CM | POA: Diagnosis not present

## 2020-03-06 DIAGNOSIS — I2510 Atherosclerotic heart disease of native coronary artery without angina pectoris: Secondary | ICD-10-CM | POA: Diagnosis not present

## 2020-03-06 DIAGNOSIS — T466X5A Adverse effect of antihyperlipidemic and antiarteriosclerotic drugs, initial encounter: Secondary | ICD-10-CM

## 2020-03-06 NOTE — Progress Notes (Addendum)
Virtual Visit via Telephone Note   This visit type was conducted due to national recommendations for restrictions regarding the COVID-19 Pandemic (e.g. social distancing) in an effort to limit this patient's exposure and mitigate transmission in our community.  Due to her co-morbid illnesses, this patient is at least at moderate risk for complications without adequate follow up.  This format is felt to be most appropriate for this patient at this time.  The patient did not have access to video technology/had technical difficulties with video requiring transitioning to audio format only (telephone).  All issues noted in this document were discussed and addressed.  No physical exam could be performed with this format.  Please refer to the patient's chart for her  consent to telehealth for [facility].   Date:  03/06/2020   ID:  Kathleen Hahn, DOB 1975-10-23, MRN [id number] The patient was identified using 2 identifiers.  Evaluation Performed:  New Patient Evaluation  Patient Location:  [address] [place] [address]  Provider location:   [address], [address] [place], [place] [address]  PCP:  Mathews Robinsons., MD  Cardiologist:  No primary care provider on file. Electrophysiologist:  None   Chief Complaint:  Familial hyperlipidemia  History of Present Illness:    Kathleen Hahn is a 44 y.o. female who presents via audio/video conferencing for a telehealth visit today. This is a pleasant 44 year old female kindly referred for evaluation and management of possible familial hyperlipidemia. Past medical history significant for pulmonary embolus, bipolar disorder and a strong family history of heart disease particularly in her father who had an MI in his 51s. She has one living child and one who is deceased in 09-13-15. Over the past couple of years she had a stress test which was abnormal suggesting possible ischemia. She then had had a CT coronary angiogram which showed very minimal  coronary disease her calcium score of one which given her age put her at the 93rd percentile. There was some concern about possible asymmetric hypertrophy and an MRI was performed. The cardiac MRI did not show any evidence of hypertrophic cardiomyopathy rather did show some LGE suggestive of possible prior myocarditis. She has a history of significant dyslipidemia. About a year ago showed 340, triglycerides 133, HDL 52 and LDL of 263. This is improved somewhat over the past year with dietary modifications, showing total cholesterol 284, triglycerides 196, HDL 47 and LDL of 200. Unfortunately she could not tolerate statin therapy in the past. She apparently had been on statins but she is not clear as to what they were. One may have been rosuvastatin. Her liver enzymes were elevated with AST of 87 and ALT of 208 about a year ago, improved to 42 and 65-82-month ago. She reports having had a prior ultrasound of her liver suggesting hepatic steatosis. Her PCP recently started her on atorvastatin 20 mg daily which she seems to be tolerating. She will have repeat liver enzymes in the middle of November and should have repeat lipid testing about 3 months after starting medication. If this is tolerated she will still not likely get maximal improvement which is suggested as at least a 50% reduction (not achievable with 20 mg atorvastatin however could be on 40 or 80 mg of atorvastatin). We also discussed the possibility of genetic testing for FH.  The patient does not have symptoms concerning for COVID-19 infection (fever, chills, cough, or new SHORTNESS OF BREATH).    Prior CV studies:   The following studies  were reviewed today:  Chart reviewed, lab work  PMHx:  Past Medical History:  Diagnosis Date  . Anxiety   . Arthritis   . Bipolar 2 disorder (HCC)   . Insomnia   . Lupus (HCC)   . Pulmonary embolism (HCC)     No past surgical history on file.  FAMHx:  Family History  Problem Relation Age of Onset   . Heart disease Father   . Heart attack Father     SOCHx:   reports that she has quit smoking. She has never used smokeless tobacco. No history on file for alcohol use and drug use.  ALLERGIES:  No Known Allergies  MEDS:  Current Meds  Medication Sig  . albuterol (PROAIR HFA) 108 (90 Base) MCG/ACT inhaler INHALE 2 PUFFS BY MOUTH EVERY 4 TO 6 HOURS AS NEEDED  . aspirin EC 81 MG tablet Take 1 tablet (81 mg total) by mouth daily.  Marland Kitchen atorvastatin (LIPITOR) 20 MG tablet Take 20 mg by mouth daily.  . budesonide-formoterol (SYMBICORT) 160-4.5 MCG/ACT inhaler INHALE 2 PUFFS BY MOUTH TWICE DAILY AS NEEDED  . Cholecalciferol (VITAMIN D3) 1.25 MG (50000 UT) CAPS Take 1 capsule by mouth once a week.  . cholestyramine (QUESTRAN) 4 g packet Take 1 packet by mouth daily.  Marland Kitchen diltiazem (CARDIZEM CD) 180 MG 24 hr capsule Take 180 mg by mouth daily.   Marland Kitchen ELIQUIS 2.5 MG TABS tablet Take 1 tablet by mouth in the morning and at bedtime.  Marland Kitchen Hyaluronan 88 MG/4ML SOSY Inject into the articular space.  . hydroxychloroquine (PLAQUENIL) 200 MG tablet Take 1 tablet by mouth 2 (two) times daily.  . hydrOXYzine (VISTARIL) 25 MG capsule Take 25 mg by mouth in the morning, at noon, and at bedtime.  . metoprolol tartrate (LOPRESSOR) 50 MG tablet TAKE 1 TABLET BY MOUTH TWICE A DAY  . mirtazapine (REMERON) 7.5 MG tablet Take 7.5 mg by mouth at bedtime.  Marland Kitchen ofloxacin (FLOXIN) 0.3 % OTIC solution 5 drops 2 (two) times daily as needed.  . sertraline (ZOLOFT) 25 MG tablet Take 25 mg by mouth daily.  Marland Kitchen VIBERZI 100 MG TABS Take 1 tablet by mouth 2 (two) times daily.  Marland Kitchen VRAYLAR capsule Take 3 mg by mouth daily.  . [DISCONTINUED] apixaban (ELIQUIS) 5 MG TABS tablet Take 1 tablet by mouth 2 (two) times daily.   . [DISCONTINUED] pregabalin (LYRICA) 100 MG capsule TAKE 1 (ONE) CAPSULE CAPSULE TWO TIMES DAILY     ROS: Pertinent items noted in HPI and remainder of comprehensive ROS otherwise negative.  Labs/Other Tests and  Data Reviewed:    Recent Labs: 03/28/2019: Hemoglobin 13.6; Platelets 322; TSH 0.771 01/24/2020: ALT 65; BUN 10; Creatinine, Ser 0.73; Potassium 4.5; Sodium 141   Recent Lipid Panel Lab Results  Component Value Date/Time   CHOL 284 (H) 01/24/2020 11:43 AM   TRIG 196 (H) 01/24/2020 11:43 AM   HDL 47 01/24/2020 11:43 AM   CHOLHDL 6.0 (H) 01/24/2020 11:43 AM   LDLCALC 200 (H) 01/24/2020 11:43 AM    Wt Readings from Last 3 Encounters:  01/12/20 262 lb 6.4 oz (119 kg)  07/12/19 269 lb (122 kg)  02/28/19 255 lb 1.3 oz (115.7 kg)     Exam:    Vital Signs:  BP 133/65   Pulse 82    Exam not performed due to telephone visit  ASSESSMENT & PLAN:    1. Probable familial hyperlipidemia 2. Statin intolerance-elevated liver enzymes 3. Fatty liver disease 4. ASCVD-CAC score  of one, 93rd percentile 5. Possible history of myocarditis based on cMRI 6. Prior pulmonary embolus  Ms. Earp has a probable familial hyperlipidemia with LDL well over 190, family history of premature coronary disease in her father and evidence of age advanced coronary disease with coronary calcification that places her in the 93rd percentile based on age and sex matched controls. Unfortunately she has fatty liver disease which is not unusual and has caused issues with elevations in her liver enzymes to greater than three times upper limit of normal. Apparently she has not previously been tried on atorvastatin which her PCP recently started at 20 mg daily. This would not provide a 50% reduction in her LDL based on current guidelines however if it is tolerated then certainly she would benefit from the effects of the statin. She also will likely qualify and benefit from a PCSK9 inhibitor which typically is much better tolerated with respect to liver enzyme elevations. We touched on this briefly today however I would like to see how she tolerates the atorvastatin and what reduction she gets from that. We could consider either  using a PCSK9 inhibitor and lieu of the statin if it is not tolerated or in addition to that to seek further reduction. Will consider referral for genetic testing in the future based on insurance.  Thanks again for the kind referral. Plan follow-up with me after lipid profile and LP(a) in 3 months.  COVID-19 Education: The signs and symptoms of COVID-19 were discussed with the patient and how to seek care for testing (follow up with PCP or arrange E-visit).  The importance of social distancing was discussed today.  Patient Risk:   After full review of this patients clinical status, I feel that they are at least moderate risk at this time.  Time:   Today, I have spent 25 minutes with the patient with telehealth technology discussing dyslipidemia, statin intolerance, fatty liver disease, recommended LDL reduction, dietary approaches to LDL reduction.     Medication Adjustments/Labs and Tests Ordered: Current medicines are reviewed at length with the patient today.  Concerns regarding medicines are outlined above.   Tests Ordered: No orders of the defined types were placed in this encounter.   Medication Changes: No orders of the defined types were placed in this encounter.   Disposition:  in 3 month(s)  Chrystie Nose, MD, [facility], FACP  [place]  [facility]  Medical Director of the Advanced Lipid Disorders &  Cardiovascular Risk Reduction Clinic Diplomate of the American Board of Clinical Lipidology Attending Cardiologist  Direct Dial: [phone]  Fax: [phone]  Website:  [email or web address]  Chrystie Nose, MD  03/06/2020 8:31 AM

## 2020-03-06 NOTE — Patient Instructions (Addendum)
Medication Instructions:  Your physician recommends that you continue on your current medications as directed. Please refer to the Current Medication list given to you today.  *If you need a refill on your cardiac medications before your next appointment, please call your pharmacy*   Lab Work: Liver Enzyme Test in 2 weeks as previously advised by PCP  Fasting Lipid Panel in 3 months per Dr. Rennis Golden -- can be done at any [facility] -- if done at PCP office, please complete about 1-2 weeks before next visit with Dr. Rennis Golden and have results faxed to [phone] (Attn: Herb Grays RN)  If you have labs (blood work) drawn today and your tests are completely normal, you will receive your results only by: Marland Kitchen MyChart Message (if you have MyChart) OR . A paper copy in the mail If you have any lab test that is abnormal or we need to change your treatment, we will call you to review the results.   Testing/Procedures: NONE   Follow-Up: At [facility], you and your health needs are our priority.  As part of our continuing mission to provide you with exceptional heart care, we have created designated Provider Care Teams.  These Care Teams include your primary Cardiologist (physician) and Advanced Practice Providers (APPs -  Physician Assistants and Nurse Practitioners) who all work together to provide you with the care you need, when you need it.  We recommend signing up for the patient portal called "MyChart".  Sign up information is provided on this After Visit Summary.  MyChart is used to connect with patients for Virtual Visits (Telemedicine).  Patients are able to view lab/test results, encounter notes, upcoming appointments, etc.  Non-urgent messages can be sent to your provider as well.   To learn more about what you can do with MyChart, go to [email or web address].    Your next appointment:   3 month(s) - lipid clinic  The format for your next appointment:   Virtual  Provider:   K. [place]  Hilty, MD   Other Instructions

## 2020-04-01 ENCOUNTER — Other Ambulatory Visit: Payer: Self-pay | Admitting: Cardiology

## 2020-04-23 ENCOUNTER — Other Ambulatory Visit: Payer: Self-pay

## 2020-04-23 MED ORDER — NITROGLYCERIN 0.4 MG SL SUBL: 0.4000 mg | SUBLINGUAL_TABLET | SUBLINGUAL | 6 refills | Status: DC | PRN

## 2020-04-23 NOTE — Telephone Encounter (Signed)
Refill sent to [facility] for Nitroglycerin 0.4 mg.

## 2020-04-24 ENCOUNTER — Other Ambulatory Visit: Payer: Self-pay | Admitting: Cardiology

## 2020-07-09 ENCOUNTER — Other Ambulatory Visit: Payer: Self-pay

## 2020-07-09 DIAGNOSIS — I2699 Other pulmonary embolism without acute cor pulmonale: Secondary | ICD-10-CM | POA: Insufficient documentation

## 2020-07-09 DIAGNOSIS — M1990 Unspecified osteoarthritis, unspecified site: Secondary | ICD-10-CM | POA: Insufficient documentation

## 2020-07-09 DIAGNOSIS — F3181 Bipolar II disorder: Secondary | ICD-10-CM | POA: Insufficient documentation

## 2020-07-09 DIAGNOSIS — F419 Anxiety disorder, unspecified: Secondary | ICD-10-CM | POA: Insufficient documentation

## 2020-07-09 DIAGNOSIS — G4700 Insomnia, unspecified: Secondary | ICD-10-CM | POA: Insufficient documentation

## 2020-07-10 ENCOUNTER — Other Ambulatory Visit: Payer: Self-pay

## 2020-07-10 ENCOUNTER — Ambulatory Visit (INDEPENDENT_AMBULATORY_CARE_PROVIDER_SITE_OTHER): Payer: Medicare HMO | Admitting: Cardiology

## 2020-07-10 ENCOUNTER — Ambulatory Visit: Payer: Medicare HMO | Admitting: Cardiology

## 2020-07-10 ENCOUNTER — Encounter: Payer: Self-pay | Admitting: Cardiology

## 2020-07-10 VITALS — BP 142/82 | HR 74 | Ht 68.0 in | Wt 274.0 lb

## 2020-07-10 DIAGNOSIS — Z86718 Personal history of other venous thrombosis and embolism: Secondary | ICD-10-CM

## 2020-07-10 DIAGNOSIS — I2510 Atherosclerotic heart disease of native coronary artery without angina pectoris: Secondary | ICD-10-CM | POA: Diagnosis not present

## 2020-07-10 DIAGNOSIS — Z86711 Personal history of pulmonary embolism: Secondary | ICD-10-CM | POA: Diagnosis not present

## 2020-07-10 DIAGNOSIS — E7849 Other hyperlipidemia: Secondary | ICD-10-CM

## 2020-07-10 DIAGNOSIS — E782 Mixed hyperlipidemia: Secondary | ICD-10-CM

## 2020-07-10 NOTE — Patient Instructions (Signed)

## 2020-07-10 NOTE — Progress Notes (Signed)
Cardiology Office Note:    Date:  07/10/2020   ID:  Kathleen Hahn, DOB 1975-09-11, MRN [id number]  PCP:  Mathews Robinsons., MD  Cardiologist:  Garwin Brothers, MD   Referring MD: Mathews Robinsons.*    ASSESSMENT:    1. Atherosclerosis of native coronary artery of native heart without angina pectoris   2. Familial hyperlipidemia   3. History of pulmonary embolism   4. History of DVT (deep vein thrombosis)   5. Mixed dyslipidemia    PLAN:    In order of problems listed above:  1. Coronary atherosclerosis: Secondary prevention stressed with the patient.  Importance of compliance with diet medication stressed and she vocalized understanding.  I told her to walk on a regular basis and she promises to do so. 2. History of DVT and pulmonary embolism: Patient is on anticoagulation and risks explained to the patient.  This is managed by primary care. 3. Familial dyslipidemia: Patient on statin therapy.  Followed by our lipid clinic colleagues.  She is going to have blood work in the next few days with the primary care provider and send [place] a copy.  Diet was emphasized. 4. Morbid obesity: Weight reduction was stressed risks of obesity explained and she promises to do better. 5. Patient will be seen in follow-up appointment in 6 months or earlier if the patient has any concerns   Medication Adjustments/Labs and Tests Ordered: Current medicines are reviewed at length with the patient today.  Concerns regarding medicines are outlined above.  No orders of the defined types were placed in this encounter.  No orders of the defined types were placed in this encounter.    No chief complaint on file.    History of Present Illness:    Kathleen Hahn is a 45 y.o. female.  Patient has past medical history of coronary atherosclerosis, essential hypertension, mixed dyslipidemia, morbid obesity and history of pulmonary embolism and DVT.  She denies any problems at this time and takes care of  activities of daily living.  No chest pain orthopnea or PND.  At the time of my evaluation, the patient is alert awake oriented and in no distress.  Past Medical History:  Diagnosis Date  . Abnormal nuclear cardiac imaging test 02/28/2019  . Anxiety   . Arthritis   . Bipolar 2 disorder (HCC)   . Carpal tunnel syndrome of left wrist 10/27/2018  . Chronic anticoagulation 12/05/2019  . COPD (chronic obstructive pulmonary disease) (HCC) 06/18/2018  . Coronary atherosclerosis 07/12/2019  . Drug therapy 06/27/2019  . Elevated LFTs 10/27/2018  . Familial hyperlipidemia 01/12/2020  . Herpes zoster without complication 01/14/2017  . History of DVT (deep vein thrombosis) 06/18/2018  . History of pericarditis 08/13/2016  . History of pulmonary embolism 05/05/2014   Formatting of this note might be different from the original. never took OC  . Insomnia   . Long-term use of Plaquenil 06/18/2018  . Lupus (HCC)   . Major depressive disorder, single episode, unspecified 05/11/2018  . Mixed dyslipidemia 07/02/2018  . Primary osteoarthritis of right knee 03/11/2017  . Pulmonary embolism (HCC)   . Recurrent falls 10/09/2016    Past Surgical History:  Procedure Laterality Date  . CHOLECYSTECTOMY    . ear drum replacement    . FOOT SURGERY    . TOTAL VAGINAL HYSTERECTOMY      Current Medications: Current Meds  Medication Sig  . albuterol (VENTOLIN HFA) 108 (90 Base) MCG/ACT inhaler Inhale 2 puffs into the lungs  every 4 (four) hours as needed for wheezing or shortness of breath.  Marland Kitchen aspirin EC 81 MG tablet Take 1 tablet (81 mg total) by mouth daily.  Marland Kitchen atorvastatin (LIPITOR) 20 MG tablet Take 20 mg by mouth daily.  . budesonide-formoterol (SYMBICORT) 160-4.5 MCG/ACT inhaler Inhale 2 puffs into the lungs as needed (shortness of breath).  . cariprazine (VRAYLAR) capsule Take 1.5 mg by mouth daily.  . Cholecalciferol (VITAMIN D3) 1.25 MG (50000 UT) CAPS Take 1 capsule by mouth once a week.  . cholestyramine  (QUESTRAN) 4 g packet Take 1 packet by mouth daily.  Marland Kitchen diltiazem (CARDIZEM CD) 180 MG 24 hr capsule Take 180 mg by mouth daily.   Marland Kitchen ELIQUIS 2.5 MG TABS tablet Take 1 tablet by mouth in the morning and at bedtime.  . hydroxychloroquine (PLAQUENIL) 200 MG tablet Take 1 tablet by mouth 2 (two) times daily.  . hydrOXYzine (VISTARIL) 25 MG capsule Take 25 mg by mouth in the morning, at noon, and at bedtime.  . metoprolol tartrate (LOPRESSOR) 50 MG tablet Take 50 mg by mouth 2 (two) times daily.  . mirtazapine (REMERON) 7.5 MG tablet Take 7.5 mg by mouth at bedtime.  . nitroGLYCERIN (NITROSTAT) 0.4 MG SL tablet Place 0.4 mg under the tongue every 5 (five) minutes as needed for chest pain.  Marland Kitchen ofloxacin (FLOXIN) 0.3 % OTIC solution Place 5 drops into both ears 2 (two) times daily as needed (ear pain).  Marland Kitchen sertraline (ZOLOFT) 50 MG tablet Take 50 mg by mouth every morning.  Marland Kitchen VIBERZI 100 MG TABS Take 1 tablet by mouth 2 (two) times daily.     Allergies:   Patient has no known allergies.   Social History   Socioeconomic History  . Marital status: Unknown    Spouse name: Not on file  . Number of children: Not on file  . Years of education: Not on file  . Highest education level: Not on file  Occupational History  . Not on file  Tobacco Use  . Smoking status: Former Games developer  . Smokeless tobacco: Never Used  Substance and Sexual Activity  . Alcohol use: Not on file  . Drug use: Not on file  . Sexual activity: Not on file  Other Topics Concern  . Not on file  Social History Narrative  . Not on file   Social Determinants of Health   Financial Resource Strain: Not on file  Food Insecurity: Not on file  Transportation Needs: Not on file  Physical Activity: Not on file  Stress: Not on file  Social Connections: Not on file     Family History: The patient's family history includes Heart attack in her father; Heart disease in her father.  ROS:   Please see the history of present illness.     All other systems reviewed and are negative.  EKGs/Labs/Other Studies Reviewed:    The following studies were reviewed today: EKG reveals sinus rhythm nonspecific ST-T changes   Recent Labs: 01/24/2020: ALT 65; BUN 10; Creatinine, Ser 0.73; Potassium 4.5; Sodium 141  Recent Lipid Panel    Component Value Date/Time   CHOL 284 (H) 01/24/2020 1143   TRIG 196 (H) 01/24/2020 1143   HDL 47 01/24/2020 1143   CHOLHDL 6.0 (H) 01/24/2020 1143   LDLCALC 200 (H) 01/24/2020 1143    Physical Exam:    VS:  BP (!) 142/82   Pulse 74   Ht 5\' 8"  (1.727 m)   Wt 274 lb (124.3 kg)  SpO2 97%   BMI 41.66 kg/m     Wt Readings from Last 3 Encounters:  07/10/20 274 lb (124.3 kg)  01/12/20 262 lb 6.4 oz (119 kg)  07/12/19 269 lb (122 kg)     GEN: Patient is in no acute distress HEENT: Normal NECK: No JVD; No carotid bruits LYMPHATICS: No lymphadenopathy CARDIAC: Hear sounds regular, 2/6 systolic murmur at the apex. RESPIRATORY:  Clear to auscultation without rales, wheezing or rhonchi  ABDOMEN: Soft, non-tender, non-distended MUSCULOSKELETAL:  No edema; No deformity  SKIN: Warm and dry NEUROLOGIC:  Alert and oriented x 3 PSYCHIATRIC:  Normal affect   Signed, Garwin Brothers, MD  07/10/2020 2:14 PM    [facility]

## 2020-10-26 ENCOUNTER — Ambulatory Visit: Payer: Medicare HMO | Admitting: Cardiology

## 2020-11-11 ENCOUNTER — Other Ambulatory Visit: Payer: Self-pay | Admitting: Cardiology

## 2020-11-15 ENCOUNTER — Other Ambulatory Visit: Payer: Self-pay | Admitting: Cardiology

## 2020-12-11 ENCOUNTER — Ambulatory Visit: Payer: Medicare HMO | Admitting: Cardiology

## 2020-12-11 ENCOUNTER — Other Ambulatory Visit: Payer: Self-pay

## 2020-12-12 ENCOUNTER — Ambulatory Visit (INDEPENDENT_AMBULATORY_CARE_PROVIDER_SITE_OTHER): Payer: Medicare HMO | Admitting: Cardiology

## 2020-12-12 ENCOUNTER — Other Ambulatory Visit: Payer: Self-pay

## 2020-12-12 ENCOUNTER — Encounter: Payer: Self-pay | Admitting: Cardiology

## 2020-12-12 VITALS — BP 128/74 | HR 80 | Ht 68.0 in | Wt 276.4 lb

## 2020-12-12 DIAGNOSIS — R079 Chest pain, unspecified: Secondary | ICD-10-CM

## 2020-12-12 DIAGNOSIS — E7849 Other hyperlipidemia: Secondary | ICD-10-CM

## 2020-12-12 DIAGNOSIS — R072 Precordial pain: Secondary | ICD-10-CM

## 2020-12-12 DIAGNOSIS — I2510 Atherosclerotic heart disease of native coronary artery without angina pectoris: Secondary | ICD-10-CM | POA: Diagnosis not present

## 2020-12-12 DIAGNOSIS — E782 Mixed hyperlipidemia: Secondary | ICD-10-CM | POA: Diagnosis not present

## 2020-12-12 MED ORDER — NITROGLYCERIN 0.4 MG SL SUBL: 0.4000 mg | SUBLINGUAL_TABLET | SUBLINGUAL | 6 refills | Status: DC | PRN

## 2020-12-12 NOTE — Progress Notes (Signed)
Cardiology Office Note:    Date:  12/12/2020   ID:  Kathleen Hahn, DOB [birth year], MRN [id number]  PCP:  Mathews Robinsons., MD  Cardiologist:  Garwin Brothers, MD   Referring MD: Mathews Robinsons.*    ASSESSMENT:    1. Atherosclerosis of native coronary artery of native heart without angina pectoris   2. Familial hyperlipidemia   3. Mixed dyslipidemia    PLAN:    In order of problems listed above:  Coronary atherosclerosis: Secondary prevention stressed with the patient.  Importance of compliance with diet medication stressed any vocalized understanding.  She has had episodes of chest discomfort.  It atypical in nature but her primary care has referred her for stress testing and she wants it to be done here so we will get it done here and send the reports to primary care.  In the interim if she has any significant symptoms she knows to go to the nearest emergency room.  Sublingual nitroglycerin prescription was sent, its protocol and 911 protocol explained and the patient vocalized understanding questions were answered to the patient's satisfaction Essential hypertension: Blood pressure stable and diet was emphasized.  Lifestyle modification urged. Mixed dyslipidemia: Lipids reviewed from her cell phone.  They appear to be doing fine and much better.  She will be back in 2 months for Chem-7 liver lipid check. She had zio monitoring with primary care and we will try to get a copy of the report. Morbid obesity: Weight reduction stressed risks of obesity explained and she promises to do better. Patient will be seen in follow-up appointment in 6 months or earlier if the patient has any concerns   Medication Adjustments/Labs and Tests Ordered: Current medicines are reviewed at length with the patient today.  Concerns regarding medicines are outlined above.  No orders of the defined types were placed in this encounter.  No orders of the defined types were placed in this  encounter.    No chief complaint on file.    History of Present Illness:    Kathleen Hahn is a 45 y.o. female.  Patient has past medical history of coronary atherosclerosis, essential hypertension, dyslipidemia and history of DVT.  She denies any problems at this time and takes care of activities of daily living.  No chest pain orthopnea or PND.  She occasionally has some chest discomfort not related to exertion.  At the time of my evaluation, the patient is alert awake oriented and in no distress.  Past Medical History:  Diagnosis Date   Abnormal nuclear cardiac imaging test 02/28/2019   Anxiety    Arthritis    Bipolar 2 disorder (HCC)    Carpal tunnel syndrome of left wrist 10/27/2018   Chronic anticoagulation 12/05/2019   COPD (chronic obstructive pulmonary disease) (HCC) 06/18/2018   Coronary atherosclerosis 07/12/2019   Drug therapy 06/27/2019   Elevated LFTs 10/27/2018   Familial hyperlipidemia 01/12/2020   Herpes zoster without complication 01/14/2017   History of DVT (deep vein thrombosis) 06/18/2018   History of pericarditis 08/13/2016   History of pulmonary embolism 05/05/2014   Formatting of this note might be different from the original. never took OC   Insomnia    Long-term use of Plaquenil 06/18/2018   Lupus (HCC)    Major depressive disorder, single episode, unspecified 05/11/2018   Mixed dyslipidemia 07/02/2018   Primary osteoarthritis of right knee 03/11/2017   Pulmonary embolism (HCC)    Recurrent falls 10/09/2016    Past Surgical History:  Procedure  Laterality Date   CHOLECYSTECTOMY     ear drum replacement     FOOT SURGERY     TOTAL VAGINAL HYSTERECTOMY      Current Medications: Current Meds  Medication Sig   albuterol (VENTOLIN HFA) 108 (90 Base) MCG/ACT inhaler Inhale 2 puffs into the lungs every 4 (four) hours as needed for wheezing or shortness of breath.   aspirin EC 81 MG tablet Take 1 tablet (81 mg total) by mouth daily.   atorvastatin (LIPITOR) 20 MG tablet  Take 20 mg by mouth daily.   budesonide-formoterol (SYMBICORT) 160-4.5 MCG/ACT inhaler Inhale 2 puffs into the lungs as needed for shortness of breath (shortness of breath).   cariprazine (VRAYLAR) 1.5 MG capsule Take 1.5 mg by mouth daily.   Cholecalciferol (VITAMIN D3) 1.25 MG (50000 UT) CAPS Take 1 capsule by mouth once a week.   cholestyramine (QUESTRAN) 4 g packet Take 1 packet by mouth daily.   diltiazem (CARDIZEM CD) 180 MG 24 hr capsule Take 180 mg by mouth daily.    ELIQUIS 2.5 MG TABS tablet Take 1 tablet by mouth in the morning and at bedtime.   hydroxychloroquine (PLAQUENIL) 200 MG tablet Take 1 tablet by mouth 2 (two) times daily.   hydrOXYzine (VISTARIL) 25 MG capsule Take 25 mg by mouth in the morning, at noon, and at bedtime.   metoprolol tartrate (LOPRESSOR) 50 MG tablet Take 50 mg by mouth 2 (two) times daily.   mirtazapine (REMERON) 7.5 MG tablet Take 7.5 mg by mouth at bedtime.   nitroGLYCERIN (NITROSTAT) 0.4 MG SL tablet Place 0.4 mg under the tongue every 5 (five) minutes as needed for chest pain.   ofloxacin (FLOXIN) 0.3 % OTIC solution Place 5 drops into both ears 2 (two) times daily as needed for pain (ear pain).   Semaglutide (OZEMPIC, 2 MG/DOSE, [place]) Inject 5 mg into the skin once a week.   sertraline (ZOLOFT) 50 MG tablet Take 50 mg by mouth every morning.   VIBERZI 100 MG TABS Take 1 tablet by mouth 2 (two) times daily.     Allergies:   Patient has no known allergies.   Social History   Socioeconomic History   Marital status: Legally Separated    Spouse name: Not on file   Number of children: Not on file   Years of education: Not on file   Highest education level: Not on file  Occupational History   Not on file  Tobacco Use   Smoking status: Former   Smokeless tobacco: Never  Substance and Sexual Activity   Alcohol use: Not on file   Drug use: Not on file   Sexual activity: Not on file  Other Topics Concern   Not on file  Social History Narrative    Not on file   Social Determinants of Health   Financial Resource Strain: Not on file  Food Insecurity: Not on file  Transportation Needs: Not on file  Physical Activity: Not on file  Stress: Not on file  Social Connections: Not on file     Family History: The patient's family history includes Heart attack in her father; Heart disease in her father.  ROS:   Please see the history of present illness.    All other systems reviewed and are negative.  EKGs/Labs/Other Studies Reviewed:    The following studies were reviewed today: I discussed my findings with the patient in extensive length.   Recent Labs: 01/24/2020: ALT 65; BUN 10; Creatinine, Ser 0.73; Potassium  4.5; Sodium 141  Recent Lipid Panel    Component Value Date/Time   CHOL 284 (H) 01/24/2020 1143   TRIG 196 (H) 01/24/2020 1143   HDL 47 01/24/2020 1143   CHOLHDL 6.0 (H) 01/24/2020 1143   LDLCALC 200 (H) 01/24/2020 1143    Physical Exam:    VS:  BP 128/74   Pulse 80   Ht 5\' 8"  (1.727 m)   Wt 276 lb 6.4 oz (125.4 kg)   SpO2 96%   BMI 42.03 kg/m     Wt Readings from Last 3 Encounters:  12/12/20 276 lb 6.4 oz (125.4 kg)  07/10/20 274 lb (124.3 kg)  01/12/20 262 lb 6.4 oz (119 kg)     GEN: Patient is in no acute distress HEENT: Normal NECK: No JVD; No carotid bruits LYMPHATICS: No lymphadenopathy CARDIAC: Hear sounds regular, 2/6 systolic murmur at the apex. RESPIRATORY:  Clear to auscultation without rales, wheezing or rhonchi  ABDOMEN: Soft, non-tender, non-distended MUSCULOSKELETAL:  No edema; No deformity  SKIN: Warm and dry NEUROLOGIC:  Alert and oriented x 3 PSYCHIATRIC:  Normal affect   Signed, 03/13/20, MD  12/12/2020 11:32 AM    [facility]

## 2020-12-12 NOTE — Patient Instructions (Addendum)
Medication Instructions:  Your physician has recommended you make the following change in your medication:   Use nitroglycerin 1 tablet placed under the tongue at the first sign of chest pain or an angina attack. 1 tablet may be used every 5 minutes as needed, for up to 15 minutes. Do not take more than 3 tablets in 15 minutes. If pain persist call 911 or go to the nearest ED.   *If you need a refill on your cardiac medications before your next appointment, please call your pharmacy*   Lab Work: Your physician recommends that you return for lab work in: 2 months You need to have labs done when you are fasting.  You can come Monday through Friday 8:30 am to 12:00 pm and 1:15 to 4:30. You do not need to make an appointment as the order has already been placed. The labs you are going to have done are BMET, LFT and Lipids.  If you have labs (blood work) drawn today and your tests are completely normal, you will receive your results only by: MyChart Message (if you have MyChart) OR A paper copy in the mail If you have any lab test that is abnormal or we need to change your treatment, we will call you to review the results.   Testing/Procedures: Your physician has requested that you have a lexiscan myoview. For further information please visit [email or web address] Please follow instruction sheet, as given.  The test will be done over 2 days and take approximately 3 to 4 hours each day to complete; you may bring reading material.  If someone comes with you to your appointment, they will need to remain in the main lobby due to limited space in the testing area. **If you are pregnant or breastfeeding, please notify the nuclear lab prior to your appointment**  How to prepare for your Myocardial Perfusion Test: Do not eat or drink 3 hours prior to your test, except you may have water. Do not consume products containing caffeine (regular or decaffeinated) 12 hours prior to your test. (ex: coffee,  chocolate, sodas, tea). Do bring a list of your current medications with you.  If not listed below, you may take your medications as normal. Do wear comfortable clothes (no dresses or overalls) and walking shoes, tennis shoes preferred (No heels or open toe shoes are allowed). Do NOT wear cologne, perfume, aftershave, or lotions (deodorant is allowed). If these instructions are not followed, your test will have to be rescheduled.    Follow-Up: At [facility], you and your health needs are our priority.  As part of our continuing mission to provide you with exceptional heart care, we have created designated Provider Care Teams.  These Care Teams include your primary Cardiologist (physician) and Advanced Practice Providers (APPs -  Physician Assistants and Nurse Practitioners) who all work together to provide you with the care you need, when you need it.  We recommend signing up for the patient portal called "MyChart".  Sign up information is provided on this After Visit Summary.  MyChart is used to connect with patients for Virtual Visits (Telemedicine).  Patients are able to view lab/test results, encounter notes, upcoming appointments, etc.  Non-urgent messages can be sent to your provider as well.   To learn more about what you can do with MyChart, go to [email or web address].    Your next appointment:   6 month(s)  The format for your next appointment:   In Person  Provider:   Belva Crome, MD  Other Instructions Cardiac Nuclear Scan A cardiac nuclear scan is a test that is done to check the flow of blood to your heart. It is done when you are resting and when you are exercising. The test looks for problems such as: Not enough blood reaching a portion of the heart. The heart muscle not working as it should. You may need this test if: You have heart disease. You have had lab results that are not normal. You have had heart surgery or a balloon procedure to open up blocked  arteries (angioplasty). You have chest pain. You have shortness of breath. In this test, a special dye (tracer) is put into your bloodstream. The tracer will travel to your heart. A camera will then take pictures of your heart to see how the tracer moves through your heart. This test is usually done at a hospital and takes 2-4 hours. Tell a doctor about: Any allergies you have. All medicines you are taking, including vitamins, herbs, eye drops, creams, and over-the-counter medicines. Any problems you or family members have had with anesthetic medicines. Any blood disorders you have. Any surgeries you have had. Any medical conditions you have. Whether you are pregnant or may be pregnant. What are the risks? Generally, this is a safe test. However, problems may occur, such as: Serious chest pain and heart attack. This is only a risk if the stress portion of the test is done. Rapid heartbeat. A feeling of warmth in your chest. This feeling usually does not last long. Allergic reaction to the tracer. What happens before the test? Ask your doctor about changing or stopping your normal medicines. This is important. Follow instructions from your doctor about what you cannot eat or drink. Remove your jewelry on the day of the test. What happens during the test? An IV tube will be inserted into one of your veins. Your doctor will give you a small amount of tracer through the IV tube. You will wait for 20-40 minutes while the tracer moves through your bloodstream. Your heart will be monitored with an electrocardiogram (ECG). You will lie down on an exam table. Pictures of your heart will be taken for about 15-20 minutes. You may also have a stress test. For this test, one of these things may be done: You will be asked to exercise on a treadmill or a stationary bike. You will be given medicines that will make your heart work harder. This is done if you are unable to exercise. When blood flow to  your heart has peaked, a tracer will again be given through the IV tube. After 20-40 minutes, you will get back on the exam table. More pictures will be taken of your heart. Depending on the tracer that is used, more pictures may need to be taken 3-4 hours later. Your IV tube will be removed when the test is over. The test may vary among doctors and hospitals. What happens after the test? Ask your doctor: Whether you can return to your normal schedule, including diet, activities, and medicines. Whether you should drink more fluids. This will help to remove the tracer from your body. Drink enough fluid to keep your pee (urine) pale yellow. Ask your doctor, or the department that is doing the test: When will my results be ready? How will I get my results? Summary A cardiac nuclear scan is a test that is done to check the flow of blood to your heart. Tell your doctor whether you are pregnant or may  be pregnant. Before the test, ask your doctor about changing or stopping your normal medicines. This is important. Ask your doctor whether you can return to your normal activities. You may be asked to drink more fluids. This information is not intended to replace advice given to you by your health care provider. Make sure you discuss any questions you have with your health care provider. Document Revised: 08/11/2018 Document Reviewed: 10/05/2017 Elsevier Patient Education  2021 [employer].

## 2020-12-19 ENCOUNTER — Telehealth: Payer: Self-pay | Admitting: *Deleted

## 2020-12-19 NOTE — Telephone Encounter (Signed)
Patient given detailed instructions per Myocardial Perfusion Study Information Sheet for the test on 12/25/20 at 1115. Patient notified to arrive 15 minutes early and that it is imperative to arrive on time for appointment to keep from having the test rescheduled.  If you need to cancel or reschedule your appointment, please call the office within 24 hours of your appointment. . Patient verbalized understanding.Phylicia Mcgaugh, Adelene Idler

## 2020-12-25 ENCOUNTER — Other Ambulatory Visit: Payer: Self-pay

## 2020-12-25 ENCOUNTER — Ambulatory Visit (INDEPENDENT_AMBULATORY_CARE_PROVIDER_SITE_OTHER): Payer: Medicare HMO

## 2020-12-25 DIAGNOSIS — R072 Precordial pain: Secondary | ICD-10-CM | POA: Diagnosis not present

## 2020-12-25 DIAGNOSIS — R079 Chest pain, unspecified: Secondary | ICD-10-CM | POA: Diagnosis not present

## 2020-12-25 DIAGNOSIS — I2510 Atherosclerotic heart disease of native coronary artery without angina pectoris: Secondary | ICD-10-CM

## 2020-12-25 MED ORDER — TECHNETIUM TC 99M TETROFOSMIN IV KIT
31.2000 | PACK | Freq: Once | INTRAVENOUS | Status: AC | PRN
  Administered 2020-12-25: 31.2 via INTRAVENOUS

## 2020-12-25 MED ORDER — REGADENOSON 0.4 MG/5ML IV SOLN
0.4000 mg | Freq: Once | INTRAVENOUS | Status: AC
  Administered 2020-12-25: 0.4 mg via INTRAVENOUS

## 2020-12-26 ENCOUNTER — Ambulatory Visit: Payer: Medicare HMO

## 2020-12-26 MED ORDER — TECHNETIUM TC 99M TETROFOSMIN IV KIT
29.9000 | PACK | Freq: Once | INTRAVENOUS | Status: AC | PRN
  Administered 2020-12-26: 29.9 via INTRAVENOUS

## 2020-12-27 LAB — MYOCARDIAL PERFUSION IMAGING
LV dias vol: 113 mL (ref 46–106)
LV sys vol: 50 mL
MPHR: 174 {beats}/min
Nuc Stress EF: 55 %
Peak HR: 90 {beats}/min
Percent HR: 51.7 %
Rest HR: 73 {beats}/min
Rest Nuclear Isotope Dose: 29.9 mCi
SDS: 1
SRS: 8
SSS: 9
Stress Nuclear Isotope Dose: 31.2 mCi
TID: 1.05

## 2020-12-31 ENCOUNTER — Telehealth: Payer: Self-pay | Admitting: Cardiology

## 2020-12-31 NOTE — Telephone Encounter (Signed)
Spoke with patient and gave her results to her stress test.  She has no reason to see [place] tomorrow as all she was wanting was to know her results.  I told patient I would cancel her appointment for tomorrow and she can keep her appointment in February.  She thanked me for the call.

## 2020-12-31 NOTE — Telephone Encounter (Signed)
Patient is looking for results for her stress test not an echocardiogram which were normal.  Patient does not need to see Dr. Tomie China tomorrow unless she is having problems.  Left a message on patient's voicemail to return my call for results.

## 2020-12-31 NOTE — Telephone Encounter (Signed)
   Pt is returning call for echo result. She also made an appt tomorrow with Dr. Mylinda Latina

## 2020-12-31 NOTE — Telephone Encounter (Signed)
Patient returning call.

## 2021-01-01 ENCOUNTER — Ambulatory Visit: Payer: Medicare HMO | Admitting: Cardiology

## 2021-01-10 ENCOUNTER — Ambulatory Visit: Payer: Medicare HMO | Admitting: Cardiology

## 2021-05-27 DIAGNOSIS — M329 Systemic lupus erythematosus, unspecified: Secondary | ICD-10-CM

## 2021-05-27 DIAGNOSIS — N3281 Overactive bladder: Secondary | ICD-10-CM

## 2021-05-27 DIAGNOSIS — K589 Irritable bowel syndrome without diarrhea: Secondary | ICD-10-CM | POA: Insufficient documentation

## 2021-05-27 HISTORY — DX: Systemic lupus erythematosus, unspecified: M32.9

## 2021-05-27 HISTORY — DX: Overactive bladder: N32.81

## 2021-05-27 HISTORY — DX: Irritable bowel syndrome without diarrhea: K58.9

## 2021-06-19 ENCOUNTER — Other Ambulatory Visit: Payer: Self-pay

## 2021-06-19 ENCOUNTER — Telehealth: Payer: Self-pay

## 2021-06-19 ENCOUNTER — Ambulatory Visit (INDEPENDENT_AMBULATORY_CARE_PROVIDER_SITE_OTHER): Payer: Medicare Other | Admitting: Cardiology

## 2021-06-19 ENCOUNTER — Encounter: Payer: Self-pay | Admitting: Cardiology

## 2021-06-19 VITALS — BP 126/74 | HR 76 | Ht 68.0 in | Wt 269.4 lb

## 2021-06-19 DIAGNOSIS — E7849 Other hyperlipidemia: Secondary | ICD-10-CM | POA: Diagnosis not present

## 2021-06-19 DIAGNOSIS — E782 Mixed hyperlipidemia: Secondary | ICD-10-CM

## 2021-06-19 DIAGNOSIS — Z1329 Encounter for screening for other suspected endocrine disorder: Secondary | ICD-10-CM

## 2021-06-19 DIAGNOSIS — Z86711 Personal history of pulmonary embolism: Secondary | ICD-10-CM | POA: Diagnosis not present

## 2021-06-19 DIAGNOSIS — R7989 Other specified abnormal findings of blood chemistry: Secondary | ICD-10-CM

## 2021-06-19 DIAGNOSIS — I2510 Atherosclerotic heart disease of native coronary artery without angina pectoris: Secondary | ICD-10-CM | POA: Diagnosis not present

## 2021-06-19 DIAGNOSIS — Z1321 Encounter for screening for nutritional disorder: Secondary | ICD-10-CM

## 2021-06-19 DIAGNOSIS — Z86718 Personal history of other venous thrombosis and embolism: Secondary | ICD-10-CM | POA: Diagnosis not present

## 2021-06-19 DIAGNOSIS — Z131 Encounter for screening for diabetes mellitus: Secondary | ICD-10-CM

## 2021-06-19 HISTORY — DX: Morbid (severe) obesity due to excess calories: E66.01

## 2021-06-19 NOTE — Progress Notes (Signed)
Cardiology Office Note:    Date:  06/19/2021   ID:  [name], DOB [birth year], MRN [id number]  PCP:  Mathews Robinsons., MD  Cardiologist:  Garwin Brothers, MD   Referring MD: Mathews Robinsons.*    ASSESSMENT:    1. Atherosclerosis of native coronary artery of native heart without angina pectoris   2. Familial hyperlipidemia   3. History of DVT (deep vein thrombosis)   4. History of pulmonary embolism   5. Morbid obesity (HCC)    PLAN:    In order of problems listed above:  Coronary artery atherosclerosis: Secondary prevention stressed to the patient.  Importance of compliance with diet and medication stressed and she vocalized understanding. Patient was advised to exercise at least half of the day 5 days a week and she promises to do so. Mixed dyslipidemia: Diet was advised.  Lipids were reviewed from [facility] sheet and discussed with her.  These are followed by primary care. Morbid obesity: Weight reduction was stressed.  Diet was emphasized and she promises to do better. On anticoagulation for history of DVT and pulmonary embolism: This is managed by primary care. Patient will be seen in follow-up appointment in 9 months or earlier if the patient has any concerns    Medication Adjustments/Labs and Tests Ordered: Current medicines are reviewed at length with the patient today.  Concerns regarding medicines are outlined above.  Orders Placed This Encounter  Procedures   Basic Metabolic Panel (BMET)   CBC   TSH   Hepatic function panel   Lipid Profile   Vitamin D 1,25 dihydroxy   HgB A1c   No orders of the defined types were placed in this encounter.    Chief Complaint  Patient presents with   Follow-up     History of Present Illness:    Kathleen Hahn is a 46 y.o. female.  Patient has past medical history of coronary artery calcification, essential hypertension, mixed dyslipidemia, history of pulmonary thromboembolism and morbid obesity.  She denies  any problems at this time and takes care of activities of daily living.  No chest pain orthopnea or PND.  At the time of my evaluation, the patient is alert awake oriented and in no distress.  Past Medical History:  Diagnosis Date   Abnormal nuclear cardiac imaging test 02/28/2019   Anxiety    Arthritis    Bipolar 2 disorder (HCC)    Carpal tunnel syndrome of left wrist 10/27/2018   Chronic anticoagulation 12/05/2019   COPD (chronic obstructive pulmonary disease) (HCC) 06/18/2018   Coronary atherosclerosis 07/12/2019   Drug therapy 06/27/2019   Elevated LFTs 10/27/2018   Familial hyperlipidemia 01/12/2020   Herpes zoster without complication 01/14/2017   History of DVT (deep vein thrombosis) 06/18/2018   History of pericarditis 08/13/2016   History of pulmonary embolism 05/05/2014   Formatting of this note might be different from the original. never took OC   IBS (irritable bowel syndrome) 05/27/2021   Insomnia    Long-term use of Plaquenil 06/18/2018   Lupus (HCC)    Lupus arthritis (HCC) 05/27/2021   Major depressive disorder, single episode, unspecified 05/11/2018   Mixed dyslipidemia 07/02/2018   Overactive bladder 05/27/2021   Primary osteoarthritis of right knee 03/11/2017   Pulmonary embolism (HCC)    Recurrent falls 10/09/2016    Past Surgical History:  Procedure Laterality Date   CHOLECYSTECTOMY     ear drum replacement     FOOT SURGERY     TOTAL VAGINAL HYSTERECTOMY  Current Medications: Current Meds  Medication Sig   albuterol (VENTOLIN HFA) 108 (90 Base) MCG/ACT inhaler Inhale 2 puffs into the lungs every 4 (four) hours as needed for wheezing or shortness of breath.   aspirin EC 81 MG tablet Take 1 tablet (81 mg total) by mouth daily.   atorvastatin (LIPITOR) 20 MG tablet Take 20 mg by mouth daily.   budesonide-formoterol (SYMBICORT) 160-4.5 MCG/ACT inhaler Inhale 2 puffs into the lungs as needed for shortness of breath (shortness of breath).   cariprazine (VRAYLAR) 1.5 MG  capsule Take 1.5 mg by mouth daily.   Cholecalciferol (VITAMIN D3) 1.25 MG (50000 UT) CAPS Take 1 capsule by mouth once a week.   cholestyramine (QUESTRAN) 4 g packet Take 1 packet by mouth daily.   diltiazem (CARDIZEM CD) 180 MG 24 hr capsule Take 180 mg by mouth daily.    ELIQUIS 2.5 MG TABS tablet Take 1 tablet by mouth in the morning and at bedtime.   hydroxychloroquine (PLAQUENIL) 200 MG tablet Take 1 tablet by mouth 2 (two) times daily.   hydrOXYzine (VISTARIL) 25 MG capsule Take 25 mg by mouth in the morning, at noon, and at bedtime.   metoprolol tartrate (LOPRESSOR) 50 MG tablet Take 50 mg by mouth 2 (two) times daily.   mirtazapine (REMERON) 7.5 MG tablet Take 7.5 mg by mouth at bedtime.   nitroGLYCERIN (NITROSTAT) 0.4 MG SL tablet Place 1 tablet (0.4 mg total) under the tongue every 5 (five) minutes as needed for chest pain.   Semaglutide (OZEMPIC, 2 MG/DOSE, [place]) Inject 5 mg into the skin once a week.   sertraline (ZOLOFT) 50 MG tablet Take 50 mg by mouth every morning.   VIBERZI 100 MG TABS Take 1 tablet by mouth 2 (two) times daily.     Allergies:   Patient has no known allergies.   Social History   Socioeconomic History   Marital status: Legally Separated    Spouse name: Not on file   Number of children: Not on file   Years of education: Not on file   Highest education level: Not on file  Occupational History   Not on file  Tobacco Use   Smoking status: Former   Smokeless tobacco: Never  Substance and Sexual Activity   Alcohol use: Not on file   Drug use: Not on file   Sexual activity: Not on file  Other Topics Concern   Not on file  Social History Narrative   Not on file   Social Determinants of Health   Financial Resource Strain: Not on file  Food Insecurity: Not on file  Transportation Needs: Not on file  Physical Activity: Not on file  Stress: Not on file  Social Connections: Not on file     Family History: The patient's family history includes Heart  attack in her father; Heart disease in her father.  ROS:   Please see the history of present illness.    All other systems reviewed and are negative.  EKGs/Labs/Other Studies Reviewed:    The following studies were reviewed today: I discussed my findings with the patient at length.  EKG revealed sinus rhythm with nonspecific ST-T changes   Recent Labs: No results found for requested labs within last 8760 hours.  Recent Lipid Panel    Component Value Date/Time   CHOL 284 (H) 01/24/2020 1143   TRIG 196 (H) 01/24/2020 1143   HDL 47 01/24/2020 1143   CHOLHDL 6.0 (H) 01/24/2020 1143   LDLCALC 200 (H)  01/24/2020 1143    Physical Exam:    VS:  BP 126/74 (BP Location: Right Arm, Patient Position: Sitting)    Pulse 76    Ht 5\' 8"  (1.727 m)    Wt 269 lb 6.4 oz (122.2 kg)    SpO2 96%    BMI 40.96 kg/m     Wt Readings from Last 3 Encounters:  06/19/21 269 lb 6.4 oz (122.2 kg)  12/25/20 276 lb (125.2 kg)  12/12/20 276 lb 6.4 oz (125.4 kg)     GEN: Patient is in no acute distress HEENT: Normal NECK: No JVD; No carotid bruits LYMPHATICS: No lymphadenopathy CARDIAC: Hear sounds regular, 2/6 systolic murmur at the apex. RESPIRATORY:  Clear to auscultation without rales, wheezing or rhonchi  ABDOMEN: Soft, non-tender, non-distended MUSCULOSKELETAL:  No edema; No deformity  SKIN: Warm and dry NEUROLOGIC:  Alert and oriented x 3 PSYCHIATRIC:  Normal affect   Signed, Jenean Lindau, MD  06/19/2021 1:44 PM    [facility]

## 2021-06-19 NOTE — Telephone Encounter (Signed)
I LM with PCP office to return  my call or fax recent Zio results.

## 2021-06-19 NOTE — Patient Instructions (Signed)
Medication Instructions:  Your physician recommends that you continue on your current medications as directed. Please refer to the Current Medication list given to you today.  *If you need a refill on your cardiac medications before your next appointment, please call your pharmacy*   Lab Work: Your physician recommends that you return for lab work in:   Labs for tomorrow: BMP, CBC, TSH, LFT, Lipid,Vitamin D, Hgb A1c  If you have labs (blood work) drawn today and your tests are completely normal, you will receive your results only by: MyChart Message (if you have MyChart) OR A paper copy in the mail If you have any lab test that is abnormal or we need to change your treatment, we will call you to review the results.   Testing/Procedures: None   Follow-Up: At [facility], you and your health needs are our priority.  As part of our continuing mission to provide you with exceptional heart care, we have created designated Provider Care Teams.  These Care Teams include your primary Cardiologist (physician) and Advanced Practice Providers (APPs -  Physician Assistants and Nurse Practitioners) who all work together to provide you with the care you need, when you need it.  We recommend signing up for the patient portal called "MyChart".  Sign up information is provided on this After Visit Summary.  MyChart is used to connect with patients for Virtual Visits (Telemedicine).  Patients are able to view lab/test results, encounter notes, upcoming appointments, etc.  Non-urgent messages can be sent to your provider as well.   To learn more about what you can do with MyChart, go to [email or web address].    Your next appointment:   9 month(s)  The format for your next appointment:   In Person  Provider:   Dr. Tomie China    Other Instructions None

## 2021-06-20 NOTE — Addendum Note (Signed)
Addended by: Eleonore Chiquito on: 06/20/2021 08:18 AM   Modules accepted: Orders

## 2021-06-21 LAB — BASIC METABOLIC PANEL
BUN/Creatinine Ratio: 14 (ref 9–23)
BUN: 10 mg/dL (ref 6–24)
CO2: 23 mmol/L (ref 20–29)
Calcium: 9.6 mg/dL (ref 8.7–10.2)
Chloride: 104 mmol/L (ref 96–106)
Creatinine, Ser: 0.7 mg/dL (ref 0.57–1.00)
Glucose: 95 mg/dL (ref 70–99)
Potassium: 4.8 mmol/L (ref 3.5–5.2)
Sodium: 139 mmol/L (ref 134–144)
eGFR: 107 mL/min/{1.73_m2} (ref >59)

## 2021-06-21 LAB — LIPID PANEL
Chol/HDL Ratio: 3.9 ratio (ref 0.0–4.4)
Cholesterol, Total: 184 mg/dL (ref 100–199)
HDL: 47 mg/dL (ref >39)
LDL Chol Calc (NIH): 101 mg/dL — ABNORMAL HIGH (ref 0–99)
Triglycerides: 210 mg/dL — ABNORMAL HIGH (ref 0–149)
VLDL Cholesterol Cal: 36 mg/dL (ref 5–40)

## 2021-06-21 LAB — HEPATIC FUNCTION PANEL
ALT: 50 IU/L — ABNORMAL HIGH (ref 0–32)
AST: 34 IU/L (ref 0–40)
Albumin: 4.2 g/dL (ref 3.8–4.8)
Alkaline Phosphatase: 242 IU/L — ABNORMAL HIGH (ref 44–121)
Bilirubin Total: 0.5 mg/dL (ref 0.0–1.2)
Bilirubin, Direct: 0.16 mg/dL (ref 0.00–0.40)
Total Protein: 6.9 g/dL (ref 6.0–8.5)

## 2021-06-21 LAB — HEMOGLOBIN A1C
Est. average glucose Bld gHb Est-mCnc: 105 mg/dL
Hgb A1c MFr Bld: 5.3 % (ref 4.8–5.6)

## 2021-06-21 LAB — CBC WITH DIFFERENTIAL/PLATELET
Basophils Absolute: 0 10*3/uL (ref 0.0–0.2)
Basos: 1 %
EOS (ABSOLUTE): 0.1 10*3/uL (ref 0.0–0.4)
Eos: 2 %
Hematocrit: 41.8 % (ref 34.0–46.6)
Hemoglobin: 13.7 g/dL (ref 11.1–15.9)
Immature Grans (Abs): 0 10*3/uL (ref 0.0–0.1)
Immature Granulocytes: 0 %
Lymphocytes Absolute: 2 10*3/uL (ref 0.7–3.1)
Lymphs: 27 %
MCH: 29.5 pg (ref 26.6–33.0)
MCHC: 32.8 g/dL (ref 31.5–35.7)
MCV: 90 fL (ref 79–97)
Monocytes Absolute: 0.4 10*3/uL (ref 0.1–0.9)
Monocytes: 5 %
Neutrophils Absolute: 4.8 10*3/uL (ref 1.4–7.0)
Neutrophils: 65 %
Platelets: 303 10*3/uL (ref 150–450)
RBC: 4.64 x10E6/uL (ref 3.77–5.28)
RDW: 13.4 % (ref 11.7–15.4)
WBC: 7.3 10*3/uL (ref 3.4–10.8)

## 2021-06-21 LAB — TSH: TSH: 0.89 u[IU]/mL (ref 0.450–4.500)

## 2021-06-21 LAB — VITAMIN D 25 HYDROXY (VIT D DEFICIENCY, FRACTURES): Vit D, 25-Hydroxy: 30.5 ng/mL (ref 30.0–100.0)

## 2021-06-21 MED ORDER — ATORVASTATIN CALCIUM 40 MG PO TABS: 20.0000 mg | ORAL_TABLET | Freq: Every day | ORAL | 3 refills | Status: DC

## 2021-06-21 NOTE — Addendum Note (Signed)
Addended by: Eleonore Chiquito on: 06/21/2021 09:39 AM   Modules accepted: Orders

## 2021-06-28 ENCOUNTER — Telehealth: Payer: Self-pay | Admitting: Cardiology

## 2021-06-28 DIAGNOSIS — R7989 Other specified abnormal findings of blood chemistry: Secondary | ICD-10-CM

## 2021-06-28 DIAGNOSIS — E782 Mixed hyperlipidemia: Secondary | ICD-10-CM

## 2021-06-28 DIAGNOSIS — E7849 Other hyperlipidemia: Secondary | ICD-10-CM

## 2021-06-28 MED ORDER — ATORVASTATIN CALCIUM 40 MG PO TABS: 40.0000 mg | ORAL_TABLET | Freq: Every day | ORAL | 3 refills | Status: DC

## 2021-06-28 NOTE — Telephone Encounter (Signed)
Pt c/o medication issue:  1. Name of Medication: atorvastatin (LIPITOR) 40 MG tablet  2. How are you currently taking this medication (dosage and times per day)? Not sure  3. Are you having a reaction (difficulty breathing--STAT)? No   4. What is your medication issue? Patient wanted to see how she should take the medication. Was told 2 different things. Please call back to discuss

## 2021-06-28 NOTE — Telephone Encounter (Signed)
Advised to take 40 mg Atorvastatin daily. Pt verbalized understanding and had no additional questions.

## 2021-10-18 IMAGING — MR MR CARDIA MORPHOLOGY W/O CM
35 of 37 series · 38 of 40 positions shown · IV contrast (gadavist)
Comparison: none

CLINICAL DATA: ?Hypertrophic cardiomyopathy

EXAM:
CARDIAC MRI
TECHNIQUE: The patient was scanned on a 1.5 Tesla GE magnet. A dedicated
cardiac coil was used. Functional imaging was done using Fiesta
sequences. [DATE], and 4 chamber views were done to assess for RWMA's.
Modified Alethea rule using a short axis stack was used to
calculate an ejection fraction on a dedicated work station using
Circle software. The patient received 14 cc of Gadavist. After 10
minutes inversion recovery sequences were used to assess for
infiltration and scar tissue.
CONTRAST:  14 cc Gadavist

[Series 6: bSSFP · oblique · 8.0mm · 1.61mm/px · 1 of 25 slices shown (1 of 18)]
[im 1/25]
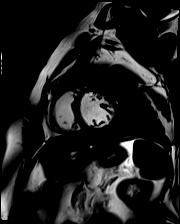

[Series 6: bSSFP · oblique · 8.0mm · 1.61mm/px · 1 of 25 slices shown (2 of 18)]
[im 1/25]
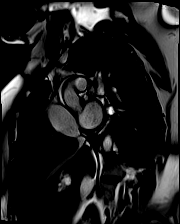

[Series 6: bSSFP · oblique · 8.0mm · 1.61mm/px · 1 of 25 slices shown (3 of 18)]
[im 1/25]
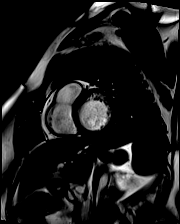

[Series 6: bSSFP · oblique · 8.0mm · 1.61mm/px · 1 of 25 slices shown (4 of 18)]
[im 1/25]
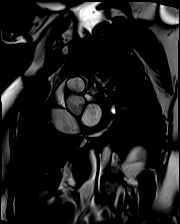

[Series 6: bSSFP · oblique · 8.0mm · 1.61mm/px · 1 of 25 slices shown (5 of 18)]
[im 1/25]
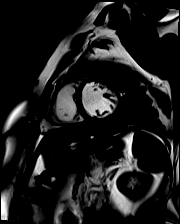

[Series 6: bSSFP · oblique · 8.0mm · 1.61mm/px · 1 of 25 slices shown (6 of 18)]
[im 1/25]
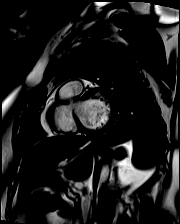

[Series 6: bSSFP · oblique · 8.0mm · 1.61mm/px · 1 of 25 slices shown (7 of 18)]
[im 1/25]
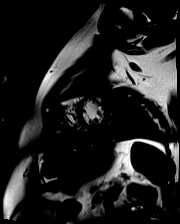

[Series 6: bSSFP · oblique · 8.0mm · 1.61mm/px · 1 of 25 slices shown (8 of 18)]
[im 1/25]
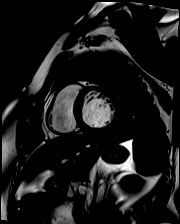

[Series 6: bSSFP · oblique · 8.0mm · 1.61mm/px · 1 of 25 slices shown (9 of 18)]
[im 1/25]
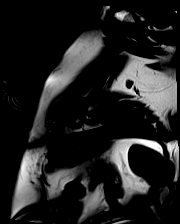

[Series 6: bSSFP · oblique · 8.0mm · 1.61mm/px · 1 of 25 slices shown (10 of 18)]
[im 1/25]
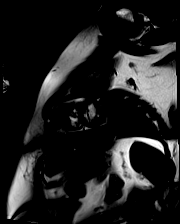

[Series 6: bSSFP · oblique · 8.0mm · 1.61mm/px · 1 of 25 slices shown (11 of 18)]
[im 1/25]
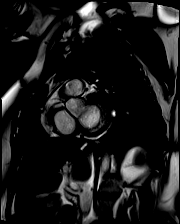

[Series 6: bSSFP · oblique · 8.0mm · 1.61mm/px · 1 of 25 slices shown (12 of 18)]
[im 1/25]
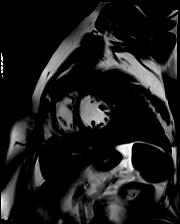

[Series 6: bSSFP · oblique · 8.0mm · 1.61mm/px · 1 of 25 slices shown (13 of 18)]
[im 1/25]
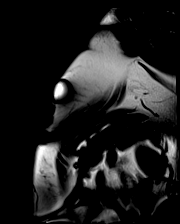

[Series 6: bSSFP · oblique · 8.0mm · 1.61mm/px · 1 of 25 slices shown (14 of 18)]
[im 1/25]
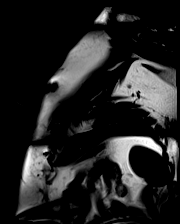

[Series 6: bSSFP · oblique · 8.0mm · 1.61mm/px · 1 of 25 slices shown (15 of 18)]
[im 1/25]
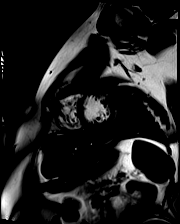

[Series 7: (id)_long_t1 · oblique · 8.0mm · 1.41mm/px · 1 of 24 slices shown]
[im 1/24]
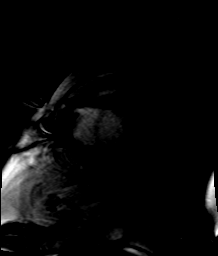

[Series 8: (id)_long_t1_moco · oblique · 8.0mm · 1.41mm/px · 1 of 24 slices shown]
[im 1/24]
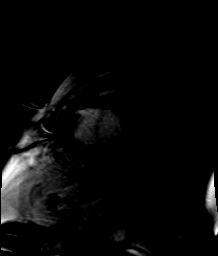

[Series 9: (id)_long_t1_moco_t1 · oblique · 8.0mm · 1.41mm/px · 1 of 6 slices shown]
[im 1/6]
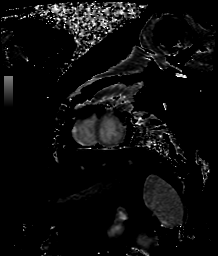

[Series 12: (id)_trufi_moco · oblique · 8.0mm · 1.88mm/px · 1 of 9 slices shown]
[im 1/9]
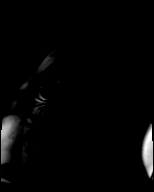

[Series 13: (id)_trufi_moco_t2 · oblique · 8.0mm · 1.88mm/px · 1 of 3 slices shown]
[im 1/3]
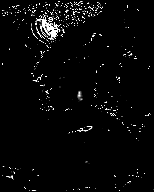

[Series 15: t2_stir_db_radial ((date)ch) · axial · 6.0mm · 1.73mm/px · 1 of 3 slices shown]
[im 1/3]
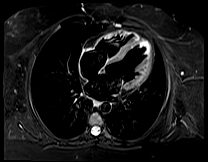

[Series 16: bSSFP · axial · 7.5mm · 1.41mm/px · 1 of 25 slices shown (16 of 18)]
[im 1/25]
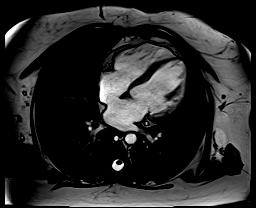

[Series 17: bSSFP · oblique · 7.5mm · 1.41mm/px · 1 of 25 slices shown (17 of 18)]
[im 1/25]
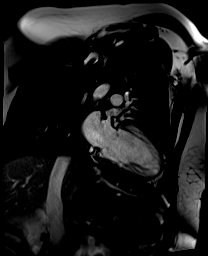

[Series 18: bSSFP · oblique · 7.5mm · 1.41mm/px · 2 of 25 slices shown (18 of 18)]
[im 1/25]
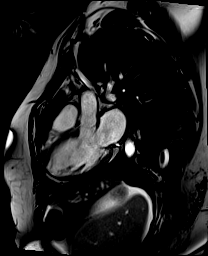
[im 25/25]
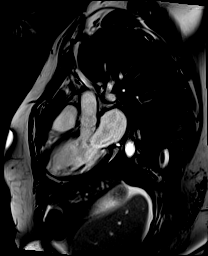

[Series 20: lge_single shot sa · oblique · 8.0mm · 1.98mm/px · 1 of 15 slices shown (1 of 2)]
[im 1/15]
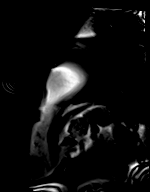

[Series 21: lge_single shot sa · oblique · 8.0mm · 1.98mm/px · 1 of 15 slices shown (2 of 2)]
[im 1/15]
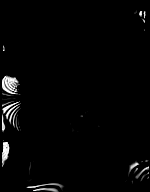

[Series 22: lge_single shot radial_mag · axial · 6.0mm · 1.98mm/px · 1 of 1 slices shown]
[im 1/1]
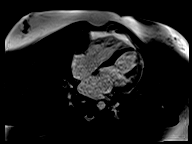

[Series 23: lge_single shot radial_psir · axial · 6.0mm · 1.98mm/px · 1 of 1 slices shown]
[im 1/1]
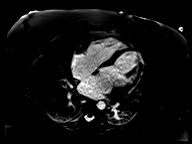

[Series 28: (id)_short_t1 · oblique · 8.0mm · 1.41mm/px · 2 of 27 slices shown]
[im 1/27]
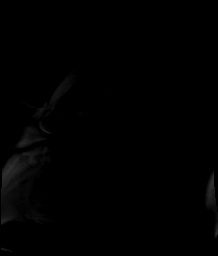
[im 27/27]
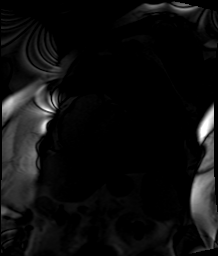

[Series 29: (id)_short_t1_moco · oblique · 8.0mm · 1.41mm/px · 2 of 27 slices shown]
[im 1/27]
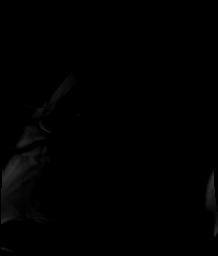
[im 27/27]
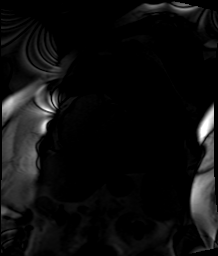

[Series 30: (id)_short_t1_moco_t1 · oblique · 8.0mm · 1.41mm/px · 1 of 6 slices shown]
[im 1/6]
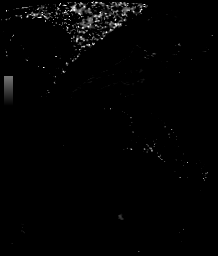

[Series 33: lge short axis_mag · oblique · 8.0mm · 1.61mm/px · 1 of 14 slices shown]
[im 1/14]
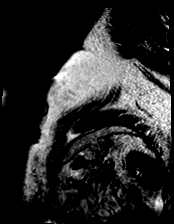

[Series 34: lge short axis_psir · oblique · 8.0mm · 1.61mm/px · 1 of 14 slices shown]
[im 1/14]
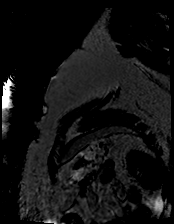

[Series 35: lge radial ((date)ch)_mag · axial · 6.0mm · 1.61mm/px · 1 of 1 slices shown]
[im 1/1]
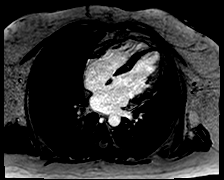

[Series 36: lge radial ((date)ch)_psir · axial · 6.0mm · 1.61mm/px · 1 of 1 slices shown]
[im 1/1]
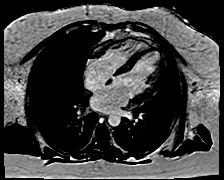

[38 of 40 positions shown; findings below may reference images not displayed]

FINDINGS: Limited images of the lung fields showed no gross abnormalities.

Normal left ventricular size and wall thickness, no asymmetric
hypertrophy noted. There is hypokinesis of the mid to apical
inferior wall, the mid to apical inferolateral wall and the apical
anterolateral wall but overall EF calculates to 57%. No increased T2
signal noted in the LV. No mitral valve systolic anterior motion or
evidence for LV outflow tract gradient. Normal right ventricular
size and systolic function, EF 61%. Mild left atrial enlargement.
Mild right atrial enlargement. Trileaflet aortic valve with no
stenosis or regurgitation. No significant mitral regurgitation.

Delayed enhancement images show mid-wall and subepicardial late
gadolinium enhancement (LGE) in the basal to mid anteroseptum and
inferoseptum. There is mid-wall LGE in the apical lateral wall.

Measurements:

LVEDV 160 mL

LVSV 90 mL
LVEF 57%

RVEDV 125 mL
RVSV 76 mL
RVEF 61%
IMPRESSION: This is not consistent with hypertrophic cardiomyopathy, there is no
LV wall hypertrophy. There is hypokinesis in the apical lateral and
inferior walls. Interestingly, there is non-coronary pattern LGE in
the basal to mid septum but also the apical lateral wall. This
picture could be consistent with prior myocarditis.

Rasmus Remy Ereline

## 2021-12-18 ENCOUNTER — Other Ambulatory Visit: Payer: Self-pay | Admitting: Cardiology

## 2021-12-18 DIAGNOSIS — I2510 Atherosclerotic heart disease of native coronary artery without angina pectoris: Secondary | ICD-10-CM

## 2022-01-17 ENCOUNTER — Other Ambulatory Visit: Payer: Self-pay

## 2022-01-17 DIAGNOSIS — E7849 Other hyperlipidemia: Secondary | ICD-10-CM

## 2022-01-17 DIAGNOSIS — E782 Mixed hyperlipidemia: Secondary | ICD-10-CM

## 2022-01-17 DIAGNOSIS — R7989 Other specified abnormal findings of blood chemistry: Secondary | ICD-10-CM

## 2022-01-17 MED ORDER — ATORVASTATIN CALCIUM 40 MG PO TABS: 40.0000 mg | ORAL_TABLET | Freq: Every day | ORAL | 0 refills | Status: AC

## 2022-06-06 ENCOUNTER — Ambulatory Visit: Payer: Medicaid Other | Admitting: Cardiology

## 2022-08-05 LAB — GENERAL HEALTH PANEL: EGFR: 90

## 2022-08-11 ENCOUNTER — Other Ambulatory Visit: Payer: Self-pay

## 2022-08-11 DIAGNOSIS — F32A Depression, unspecified: Secondary | ICD-10-CM | POA: Insufficient documentation

## 2022-08-11 DIAGNOSIS — R5383 Other fatigue: Secondary | ICD-10-CM | POA: Insufficient documentation

## 2022-08-11 HISTORY — DX: Other fatigue: R53.83

## 2022-08-11 HISTORY — DX: Depression, unspecified: F32.A

## 2022-08-13 ENCOUNTER — Ambulatory Visit: Payer: 59 | Attending: Cardiology | Admitting: Cardiology

## 2022-08-13 ENCOUNTER — Encounter: Payer: Self-pay | Admitting: Cardiology

## 2022-08-13 VITALS — BP 115/78 | HR 73 | Ht 68.6 in | Wt 272.8 lb

## 2022-08-13 DIAGNOSIS — I2510 Atherosclerotic heart disease of native coronary artery without angina pectoris: Secondary | ICD-10-CM | POA: Diagnosis not present

## 2022-08-13 DIAGNOSIS — E7849 Other hyperlipidemia: Secondary | ICD-10-CM

## 2022-08-13 DIAGNOSIS — E782 Mixed hyperlipidemia: Secondary | ICD-10-CM

## 2022-08-13 DIAGNOSIS — Z86711 Personal history of pulmonary embolism: Secondary | ICD-10-CM

## 2022-08-13 NOTE — Progress Notes (Signed)
Cardiology Office Note:    Date:  08/13/2022   ID:  Kathleen Hahn, DOB 1975/05/06, MRN [id number]  PCP:  Mathews Robinsons., MD  Cardiologist:  Garwin Brothers, MD   Referring MD: Mathews Robinsons.*    ASSESSMENT:    1. Coronary artery disease involving native coronary artery of native heart, unspecified whether angina present   2. Familial hyperlipidemia   3. History of pulmonary embolism   4. Mixed dyslipidemia   5. Morbid obesity    PLAN:    In order of problems listed above:  Coronary artery calcification: Secondary prevention stressed with the patient.  Importance of compliance with diet medication stressed and she vocalized understanding.  She is unable to ambulate because of the fall yesterday and consequent to make jewelry.  She is being evaluated and followed by primary care for this. Mixed dyslipidemia: On lipid-lowering medications followed by primary care.  Lipids reviewed and discussed with her at length. Morbid obesity: Weight reduction stressed diet emphasized risks of obesity explained and she promises to do better. Patient will be seen in follow-up appointment in 12 months or earlier if the patient has any concerns.    Medication Adjustments/Labs and Tests Ordered: Current medicines are reviewed at length with the patient today.  Concerns regarding medicines are outlined above.  Orders Placed This Encounter  Procedures   EKG 12-Lead   No orders of the defined types were placed in this encounter.    No chief complaint on file.    History of Present Illness:    Kathleen Hahn is a 47 y.o. female.  Patient has past medical history of coronary artery calcification, mixed dyslipidemia, morbid obesity and history of pulmonary embolism on anticoagulation.  She denies any problems at this time and had a fall yesterday.  She was evaluated by her physician and given orthopedic consultation in the next few days coming up.  Meanwhile she has a brace on  the right knee.  At the time of my evaluation, the patient is alert awake oriented and in no distress.  Past Medical History:  Diagnosis Date   Abnormal nuclear cardiac imaging test 02/28/2019   Anxiety    Arthritis    Bipolar 2 disorder    Carpal tunnel syndrome of left wrist 10/27/2018   Chronic anticoagulation 12/05/2019   COPD (chronic obstructive pulmonary disease) 06/18/2018   Coronary atherosclerosis 07/12/2019   Depressive disorder 08/11/2022   Drug therapy 06/27/2019   Elevated LFTs 10/27/2018   Familial hyperlipidemia 01/12/2020   Fatigue 08/11/2022   Herpes zoster without complication 01/14/2017   History of DVT (deep vein thrombosis) 06/18/2018   History of pericarditis 08/13/2016   History of pulmonary embolism 05/05/2014   Formatting of this note might be different from the original. never took OC   IBS (irritable bowel syndrome) 05/27/2021   Insomnia    Long-term use of Plaquenil 06/18/2018   Lupus    Lupus arthritis 05/27/2021   Major depressive disorder, single episode, unspecified 05/11/2018   Mixed dyslipidemia 07/02/2018   Morbid obesity 06/19/2021   Overactive bladder 05/27/2021   Primary osteoarthritis of right knee 03/11/2017   Pulmonary embolism    Recurrent falls 10/09/2016    Past Surgical History:  Procedure Laterality Date   CHOLECYSTECTOMY     ear drum replacement     FOOT SURGERY     TOTAL VAGINAL HYSTERECTOMY      Current Medications: Current Meds  Medication Sig   albuterol (VENTOLIN HFA) 108 (90 Base)  MCG/ACT inhaler Inhale 2 puffs into the lungs every 4 (four) hours as needed for wheezing or shortness of breath.   aspirin EC 81 MG tablet Take 1 tablet (81 mg total) by mouth daily.   atorvastatin (LIPITOR) 40 MG tablet Take 1 tablet (40 mg total) by mouth daily.   budesonide-formoterol (SYMBICORT) 160-4.5 MCG/ACT inhaler Inhale 2 puffs into the lungs as needed for shortness of breath (shortness of breath).   cariprazine (VRAYLAR) 1.5  MG capsule Take 1.5 mg by mouth daily.   Cholecalciferol (VITAMIN D3) 1.25 MG (50000 UT) CAPS Take 1 capsule by mouth once a week.   cholestyramine (QUESTRAN) 4 g packet Take 1 packet by mouth daily.   diltiazem (CARDIZEM CD) 180 MG 24 hr capsule Take 180 mg by mouth daily.    ELIQUIS 2.5 MG TABS tablet Take 1 tablet by mouth in the morning and at bedtime.   ezetimibe (ZETIA) 10 MG tablet Take 10 mg by mouth daily.   gabapentin (NEURONTIN) 600 MG tablet Take 600 mg by mouth 2 (two) times daily.   hydroxychloroquine (PLAQUENIL) 200 MG tablet Take 1 tablet by mouth 2 (two) times daily.   hydrOXYzine (VISTARIL) 50 MG capsule Take 50 mg by mouth 2 (two) times daily.   metoprolol tartrate (LOPRESSOR) 50 MG tablet Take 50 mg by mouth 2 (two) times daily.   mirtazapine (REMERON) 15 MG tablet Take 15 mg by mouth at bedtime.   nitroGLYCERIN (NITROSTAT) 0.4 MG SL tablet Place 1 tablet (0.4 mg total) under the tongue every 5 (five) minutes as needed for chest pain.   oxybutynin (DITROPAN) 5 MG tablet Take 5 mg by mouth 3 (three) times daily.   Semaglutide (OZEMPIC, 2 MG/DOSE, [place]) Inject 5 mg into the skin once a week.   sertraline (ZOLOFT) 100 MG tablet Take 150 mg by mouth daily.   VIBERZI 100 MG TABS Take 1 tablet by mouth 2 (two) times daily.   Vitamin D, Ergocalciferol, (DRISDOL) 1.25 MG (50000 UNIT) CAPS capsule Take 50,000 Units by mouth once a week.     Allergies:   Patient has no known allergies.   Social History   Socioeconomic History   Marital status: Divorced    Spouse name: Not on file   Number of children: Not on file   Years of education: Not on file   Highest education level: Not on file  Occupational History   Not on file  Tobacco Use   Smoking status: Former   Smokeless tobacco: Never  Substance and Sexual Activity   Alcohol use: Not on file   Drug use: Not on file   Sexual activity: Not on file  Other Topics Concern   Not on file  Social History Narrative   Not on  file   Social Determinants of Health   Financial Resource Strain: Not on file  Food Insecurity: Not on file  Transportation Needs: Not on file  Physical Activity: Not on file  Stress: Not on file  Social Connections: Not on file     Family History: The patient's family history includes Heart attack in her father; Heart disease in her father.  ROS:   Please see the history of present illness.    All other systems reviewed and are negative.  EKGs/Labs/Other Studies Reviewed:    The following studies were reviewed today: EKG reveals poor anterior forces and left posterior fascicular block.   Recent Labs: No results found for requested labs within last 365 days.  Recent Lipid  Panel    Component Value Date/Time   CHOL 184 06/20/2021 0822   TRIG 210 (H) 06/20/2021 0822   HDL 47 06/20/2021 0822   CHOLHDL 3.9 06/20/2021 0822   LDLCALC 101 (H) 06/20/2021 0822    Physical Exam:    VS:  BP 115/78   Pulse 73   Ht 5' 8.6" (1.742 m)   Wt 272 lb 12.8 oz (123.7 kg)   SpO2 96%   BMI 40.76 kg/m     Wt Readings from Last 3 Encounters:  08/13/22 272 lb 12.8 oz (123.7 kg)  06/19/21 269 lb 6.4 oz (122.2 kg)  12/25/20 276 lb (125.2 kg)     GEN: Patient is in no acute distress HEENT: Normal NECK: No JVD; No carotid bruits LYMPHATICS: No lymphadenopathy CARDIAC: Hear sounds regular, 2/6 systolic murmur at the apex. RESPIRATORY:  Clear to auscultation without rales, wheezing or rhonchi  ABDOMEN: Soft, non-tender, non-distended MUSCULOSKELETAL:  No edema; No deformity  SKIN: Warm and dry NEUROLOGIC:  Alert and oriented x 3 PSYCHIATRIC:  Normal affect   Signed, Garwin Brothersajan R Giuseppina Quinones, MD  08/13/2022 1:38 PM    [facility]

## 2022-08-13 NOTE — Patient Instructions (Addendum)
Medication Instructions:  Your physician recommends that you continue on your current medications as directed. Please refer to the Current Medication list given to you today.  *If you need a refill on your cardiac medications before your next appointment, please call your pharmacy*   Lab Work: None ordered If you have labs (blood work) drawn today and your tests are completely normal, you will receive your results only by: MyChart Message (if you have MyChart) OR A paper copy in the mail If you have any lab test that is abnormal or we need to change your treatment, we will call you to review the results.   Testing/Procedures: None ordered   Follow-Up: At [facility], you and your health needs are our priority.  As part of our continuing mission to provide you with exceptional heart care, we have created designated Provider Care Teams.  These Care Teams include your primary Cardiologist (physician) and Advanced Practice Providers (APPs -  Physician Assistants and Nurse Practitioners) who all work together to provide you with the care you need, when you need it.  We recommend signing up for the patient portal called "MyChart".  Sign up information is provided on this After Visit Summary.  MyChart is used to connect with patients for Virtual Visits (Telemedicine).  Patients are able to view lab/test results, encounter notes, upcoming appointments, etc.  Non-urgent messages can be sent to your provider as well.   To learn more about what you can do with MyChart, go to [email or web address].    Your next appointment:   12 month(s)  The format for your next appointment:   In Person  Provider:   Rajan Revankar, MD    Other Instructions none  Important Information About Sugar      

## 2022-08-26 ENCOUNTER — Encounter: Payer: Self-pay | Admitting: Cardiology

## 2022-08-29 ENCOUNTER — Telehealth: Payer: Self-pay

## 2022-08-29 NOTE — Telephone Encounter (Signed)
   Pre-operative Risk Assessment    Patient Name: Kathleen Hahn  DOB: 1976/03/15 MRN: [id number]      Request for Surgical Clearance    Procedure:   total right knee arthroplasty  Date of Surgery:  Clearance 10/08/22                                 Surgeon:   Surgeon's Group or Practice Name:  [facility] Phone number:  [phone] Fax number:  [phone]   Type of Clearance Requested:   - Medical    Type of Anesthesia:  Not Indicated   Additional requests/questions:    SignedDione Housekeeper   08/29/2022, 10:08 AM

## 2022-09-03 NOTE — Telephone Encounter (Signed)
   Patient Name: Kathleen Hahn  DOB: 1975-08-25 MRN: [id number]  Primary Cardiologist: None  Chart reviewed as part of pre-operative protocol coverage. Given past medical history and time since last visit, based on ACC/AHA guidelines, Kathleen Hahn is at acceptable risk for the planned procedure without further cardiovascular testing.  Per Dr. Tomie China patient is not an high risk due to recent negative stress test.  He recommends meticulous monitoring through the perioperative and postoperative period.  The patient was advised that if she develops new symptoms prior to surgery to contact our office to arrange for a follow-up visit, and she verbalized understanding.  I will route this recommendation to the requesting party via Epic fax function and remove from pre-op pool.  Please call with questions.  Napoleon Form, Leodis Rains, NP 09/03/2022, 1:29 PM

## 2022-10-21 ENCOUNTER — Other Ambulatory Visit: Payer: Self-pay | Admitting: Cardiology

## 2022-10-21 DIAGNOSIS — E782 Mixed hyperlipidemia: Secondary | ICD-10-CM

## 2022-10-21 DIAGNOSIS — E7849 Other hyperlipidemia: Secondary | ICD-10-CM

## 2022-10-21 DIAGNOSIS — R7989 Other specified abnormal findings of blood chemistry: Secondary | ICD-10-CM

## 2023-10-21 LAB — COMPREHENSIVE METABOLIC PANEL WITH GFR: EGFR: 90

## 2023-10-22 LAB — HEMOGLOBIN A1C: A1c: 6.2

## 2023-12-19 LAB — COMPREHENSIVE METABOLIC PANEL WITH GFR: EGFR: 86

## 2023-12-23 ENCOUNTER — Ambulatory Visit: Admitting: Cardiology

## 2023-12-24 ENCOUNTER — Ambulatory Visit: Attending: Cardiology | Admitting: Cardiology

## 2023-12-24 ENCOUNTER — Encounter: Payer: Self-pay | Admitting: Cardiology

## 2023-12-24 VITALS — BP 116/78 | HR 77 | Ht 68.0 in | Wt 282.2 lb

## 2023-12-24 DIAGNOSIS — I2510 Atherosclerotic heart disease of native coronary artery without angina pectoris: Secondary | ICD-10-CM

## 2023-12-24 DIAGNOSIS — E782 Mixed hyperlipidemia: Secondary | ICD-10-CM

## 2023-12-24 DIAGNOSIS — Z86711 Personal history of pulmonary embolism: Secondary | ICD-10-CM | POA: Diagnosis not present

## 2023-12-24 MED ORDER — NITROGLYCERIN 0.4 MG SL SUBL: 0.4000 mg | SUBLINGUAL_TABLET | SUBLINGUAL | 5 refills | Status: DC | PRN

## 2023-12-24 NOTE — Progress Notes (Signed)
 Cardiology Office Note:    Date:  12/24/2023   ID:  Kathleen Hahn, DOB 1975/06/07, MRN [id number]  PCP:  Samara Sharolyn LABOR., MD  Cardiologist:  Jennifer JONELLE Crape, MD   Referring MD: Samara Sharolyn LABOR.*    ASSESSMENT:    1. Mixed dyslipidemia   2. Atherosclerosis of native coronary artery of native heart without angina pectoris   3. History of pulmonary embolism   4. Morbid obesity (HCC)    PLAN:    In order of problems listed above:  Coronary artery disease: Secondary prevention stressed with the patient.  Importance of compliance with diet medication stressed and patient verbalized standing.  She is on a regular basis at least half another day. History of PE: On anticoagulation followed by primary care. Essential hypertension: Blood pressure stable and diet was emphasized. Mixed dyslipidemia: Lipid-lowering medications followed by primary care.  Goal LDL should be less than 60. Morbid obesity: Weight reduction stressed diet emphasized and she promises to do better.  Risks of obesity explained. Patient will be seen in follow-up appointment in 6 months or earlier if the patient has any concerns.    Medication Adjustments/Labs and Tests Ordered: Current medicines are reviewed at length with the patient today.  Concerns regarding medicines are outlined above.  Orders Placed This Encounter  Procedures   EKG 12-Lead   No orders of the defined types were placed in this encounter.    No chief complaint on file.    History of Present Illness:    Kathleen Hahn is a 48 y.o. female.  Patient has past medical history of coronary arteriosclerosis, history of pulmonary embolism, COPD, essential hypertension and mixed dyslipidemia.  She denies any problems at this time and takes care of activities of daily living.  No chest pain orthopnea or PND.  She does not exercise on a regular basis.  She is morbidly obese.  At the time of my evaluation, the patient is alert awake  oriented and in no distress.  Past Medical History:  Diagnosis Date   Abnormal nuclear cardiac imaging test 02/28/2019   Anxiety    Arthritis    Bipolar 2 disorder (HCC)    Carpal tunnel syndrome of left wrist 10/27/2018   Chronic anticoagulation 12/05/2019   COPD (chronic obstructive pulmonary disease) (HCC) 06/18/2018   Coronary atherosclerosis 07/12/2019   Depressive disorder 08/11/2022   Drug therapy 06/27/2019   Elevated LFTs 10/27/2018   Familial hyperlipidemia 01/12/2020   Fatigue 08/11/2022   Herpes zoster without complication 01/14/2017   History of DVT (deep vein thrombosis) 06/18/2018   History of pericarditis 08/13/2016   History of pulmonary embolism 05/05/2014   Formatting of this note might be different from the original. never took OC   Insomnia    Long-term use of Plaquenil 06/18/2018   Lupus    Lupus arthritis (HCC) 05/27/2021   Major depressive disorder, single episode, unspecified 05/11/2018   Mixed dyslipidemia 07/02/2018   Morbid obesity (HCC) 06/19/2021   Overactive bladder 05/27/2021   Primary osteoarthritis of right knee 03/11/2017   Pulmonary embolism (HCC)    Recurrent falls 10/09/2016    Past Surgical History:  Procedure Laterality Date   CHOLECYSTECTOMY     ear drum replacement     FOOT SURGERY     TOTAL VAGINAL HYSTERECTOMY      Current Medications: Current Meds  Medication Sig   albuterol (VENTOLIN HFA) 108 (90 Base) MCG/ACT inhaler Inhale 2 puffs into the lungs every 4 (four) hours as needed  for wheezing or shortness of breath.   aspirin  EC 81 MG tablet Take 1 tablet (81 mg total) by mouth daily.   atorvastatin  (LIPITOR) 40 MG tablet Take 1 tablet (40 mg total) by mouth daily.   BELSOMRA 20 MG TABS Take 1 tablet by mouth at bedtime as needed (sleep).   budesonide-formoterol (SYMBICORT) 160-4.5 MCG/ACT inhaler Inhale 2 puffs into the lungs as needed for shortness of breath (shortness of breath).   cariprazine (VRAYLAR) 1.5 MG capsule  Take 1.5 mg by mouth daily.   Cholecalciferol (VITAMIN D3) 1.25 MG (50000 UT) CAPS Take 1 capsule by mouth once a week.   cholestyramine (QUESTRAN) 4 g packet Take 1 packet by mouth daily.   diltiazem (CARDIZEM CD) 180 MG 24 hr capsule Take 180 mg by mouth daily.    diltiazem (TIAZAC) 180 MG 24 hr capsule Take 180 mg by mouth daily.   ELIQUIS 2.5 MG TABS tablet Take 1 tablet by mouth in the morning and at bedtime.   ezetimibe (ZETIA) 10 MG tablet Take 10 mg by mouth daily.   FLUoxetine (PROZAC) 20 MG tablet Take 40 mg by mouth every morning.   gabapentin (NEURONTIN) 600 MG tablet Take 600 mg by mouth 2 (two) times daily.   hydroxychloroquine (PLAQUENIL) 200 MG tablet Take 1 tablet by mouth 2 (two) times daily.   hydrOXYzine (VISTARIL) 50 MG capsule Take 50 mg by mouth 2 (two) times daily.   metoprolol  tartrate (LOPRESSOR ) 50 MG tablet Take 50 mg by mouth 2 (two) times daily.   mirtazapine (REMERON) 15 MG tablet Take 15 mg by mouth at bedtime.   nitroGLYCERIN  (NITROSTAT ) 0.4 MG SL tablet Place 1 tablet (0.4 mg total) under the tongue every 5 (five) minutes as needed for chest pain.   oxybutynin (DITROPAN) 5 MG tablet Take 5 mg by mouth 3 (three) times daily.   pantoprazole (PROTONIX) 40 MG tablet Take 40 mg by mouth daily.   rOPINIRole (REQUIP) 0.5 MG tablet Take 0.5 mg by mouth at bedtime.   Semaglutide (OZEMPIC, 2 MG/DOSE, [place]) Inject 5 mg into the skin once a week.   sertraline (ZOLOFT) 100 MG tablet Take 150 mg by mouth daily.   tamsulosin (FLOMAX) 0.4 MG CAPS capsule Take 0.4 mg by mouth at bedtime.   VIBERZI 100 MG TABS Take 1 tablet by mouth 2 (two) times daily.   Vitamin D, Ergocalciferol, (DRISDOL) 1.25 MG (50000 UNIT) CAPS capsule Take 50,000 Units by mouth once a week.     Allergies:   Patient has no known allergies.   Social History   Socioeconomic History   Marital status: Divorced    Spouse name: Not on file   Number of children: Not on file   Years of education: Not on  file   Highest education level: Not on file  Occupational History   Not on file  Tobacco Use   Smoking status: Former   Smokeless tobacco: Never  Substance and Sexual Activity   Alcohol  use: Not on file   Drug use: Not on file   Sexual activity: Not on file  Other Topics Concern   Not on file  Social History Narrative   Not on file   Social Drivers of Health   Financial Resource Strain: Low Risk  (08/08/2023)   Received from [facility]   Overall Financial Resource Strain (CARDIA)    Difficulty of Paying Living Expenses: Not hard at all  Food Insecurity: No Food Insecurity (08/08/2023)   Received from [facility]   Hunger Vital Sign    Within the past 12 months, you worried that your food would run out before you got the money to buy more.: Never true    Within the past 12 months, the food you bought just didn't last and you didn't have money to get more.: Never true  Transportation Needs: No Transportation Needs (08/08/2023)   Received from [facility]   PRAPARE - Transportation    Lack of Transportation (Medical): No    Lack of Transportation (Non-Medical): No  Physical Activity: Insufficiently Active (08/08/2023)   Received from [facility]   Exercise Vital Sign    On average, how many days per week do you engage in moderate to strenuous exercise (like a brisk walk)?: 2 days    On average, how many minutes do you engage in exercise at this level?: 20 min  Stress: Stress Concern Present (08/08/2023)   Received from [facility] - Occupational Stress Questionnaire    Feeling of Stress : Rather much  Social Connections: Socially Integrated (08/08/2023)   Received from [facility]   Social Network    How would you rate your social network (family, work, friends)?: Good participation with social networks     Family History: The patient's family history includes Heart attack in her father; Heart disease in her father.  ROS:    Please see the history of present illness.    All other systems reviewed and are negative.  EKGs/Labs/Other Studies Reviewed:    The following studies were reviewed today: .SABRAEKG Interpretation Date/Time:  Thursday December 24 2023 12:52:09 EDT Ventricular Rate:  77 PR Interval:  186 QRS Duration:  84 QT Interval:  396 QTC Calculation: 448 R Axis:   103  Text Interpretation: Sinus rhythm with frequent Premature ventricular complexes Rightward axis Possible Anterior infarct , age undetermined T wave abnormality, consider lateral ischemia Abnormal ECG No previous ECGs available Confirmed by Edwyna Backers [phone]) on 12/24/2023 1:01:23 PM     Recent Labs: No results found for requested labs within last 365 days.  Recent Lipid Panel    Component Value Date/Time   CHOL 184 06/20/2021 0822   TRIG 210 (H) 06/20/2021 0822   HDL 47 06/20/2021 0822   CHOLHDL 3.9 06/20/2021 0822   LDLCALC 101 (H) 06/20/2021 0822    Physical Exam:    VS:  BP 116/78   Pulse 77   Ht 5' 8 (1.727 m)   Wt 282 lb 3.2 oz (128 kg)   SpO2 97%   BMI 42.91 kg/m     Wt Readings from Last 3 Encounters:  12/24/23 282 lb 3.2 oz (128 kg)  08/13/22 272 lb 12.8 oz (123.7 kg)  06/19/21 269 lb 6.4 oz (122.2 kg)     GEN: Patient is in no acute distress HEENT: Normal NECK: No JVD; No carotid bruits LYMPHATICS: No lymphadenopathy CARDIAC: Hear sounds regular, 2/6 systolic murmur at the apex. RESPIRATORY:  Clear to auscultation without rales, wheezing or rhonchi  ABDOMEN: Soft, non-tender, non-distended MUSCULOSKELETAL:  No edema; No deformity  SKIN: Warm and dry NEUROLOGIC:  Alert and oriented x 3 PSYCHIATRIC:  Normal affect   Signed, Backers JONELLE Edwyna, MD  12/24/2023 1:05 PM    [facility]

## 2023-12-24 NOTE — Patient Instructions (Signed)

## 2024-05-16 ENCOUNTER — Encounter: Payer: Self-pay | Admitting: Cardiology

## 2024-05-17 ENCOUNTER — Other Ambulatory Visit: Payer: Self-pay

## 2024-05-21 ENCOUNTER — Other Ambulatory Visit: Payer: Self-pay | Admitting: Cardiology

## 2024-05-21 DIAGNOSIS — I2510 Atherosclerotic heart disease of native coronary artery without angina pectoris: Secondary | ICD-10-CM
